# Patient Record
Sex: Male | Born: 1960 | Race: White | Hispanic: No | Smoking: Never smoker
Health system: Southern US, Community
[De-identification: ages and names within clinical notes are randomized; demographics above are authoritative.]

## PROBLEM LIST (undated history)

## (undated) DIAGNOSIS — E785 Hyperlipidemia, unspecified: Secondary | ICD-10-CM

## (undated) DIAGNOSIS — R0683 Snoring: Secondary | ICD-10-CM

## (undated) DIAGNOSIS — R7303 Prediabetes: Secondary | ICD-10-CM

## (undated) DIAGNOSIS — I10 Essential (primary) hypertension: Secondary | ICD-10-CM

## (undated) DIAGNOSIS — R079 Chest pain, unspecified: Principal | ICD-10-CM

## (undated) HISTORY — DX: Essential (primary) hypertension: I10

## (undated) HISTORY — PX: BACK SURGERY: SHX140

## (undated) HISTORY — DX: Prediabetes: R73.03

## (undated) HISTORY — DX: Hyperlipidemia, unspecified: E78.5

## (undated) HISTORY — DX: Snoring: R06.83

## (undated) HISTORY — PX: TESTICLE SURGERY: SHX794

## (undated) HISTORY — DX: Chest pain, unspecified: R07.9

---

## 2003-01-05 ENCOUNTER — Encounter: Payer: Self-pay | Admitting: Unknown Physician Specialty

## 2003-01-05 ENCOUNTER — Ambulatory Visit (HOSPITAL_COMMUNITY): Admission: RE | Admit: 2003-01-05 | Discharge: 2003-01-05 | Payer: Self-pay | Admitting: Unknown Physician Specialty

## 2003-01-17 ENCOUNTER — Encounter: Admission: RE | Admit: 2003-01-17 | Discharge: 2003-01-24 | Payer: Self-pay | Admitting: Unknown Physician Specialty

## 2003-02-13 ENCOUNTER — Encounter: Payer: Self-pay | Admitting: Neurosurgery

## 2003-02-13 ENCOUNTER — Ambulatory Visit (HOSPITAL_COMMUNITY): Admission: RE | Admit: 2003-02-13 | Discharge: 2003-02-14 | Payer: Self-pay | Admitting: Neurosurgery

## 2005-02-02 ENCOUNTER — Ambulatory Visit: Payer: Self-pay | Admitting: Family Medicine

## 2005-02-03 ENCOUNTER — Ambulatory Visit (HOSPITAL_COMMUNITY): Admission: RE | Admit: 2005-02-03 | Discharge: 2005-02-03 | Payer: Self-pay | Admitting: Family Medicine

## 2005-03-19 ENCOUNTER — Ambulatory Visit: Payer: Self-pay | Admitting: Family Medicine

## 2005-08-20 ENCOUNTER — Ambulatory Visit: Payer: Self-pay | Admitting: Family Medicine

## 2005-09-25 ENCOUNTER — Ambulatory Visit (HOSPITAL_BASED_OUTPATIENT_CLINIC_OR_DEPARTMENT_OTHER): Admission: RE | Admit: 2005-09-25 | Discharge: 2005-09-25 | Payer: Self-pay | Admitting: Urology

## 2007-05-13 ENCOUNTER — Emergency Department (HOSPITAL_COMMUNITY): Admission: EM | Admit: 2007-05-13 | Discharge: 2007-05-13 | Payer: Self-pay | Admitting: Emergency Medicine

## 2007-12-18 ENCOUNTER — Emergency Department (HOSPITAL_COMMUNITY): Admission: EM | Admit: 2007-12-18 | Discharge: 2007-12-18 | Payer: Self-pay | Admitting: Emergency Medicine

## 2010-12-26 NOTE — Op Note (Signed)
NAMEDRAYSON, DORKO                           ACCOUNT NO.:  [id number]   MEDICAL RECORD NO.:  [id number]                   PATIENT TYPE:  OIB   LOCATION:  2899                                 FACILITY:  [facility]   PHYSICIAN:  Reinaldo Meeker, M.D.              DATE OF BIRTH:  07/15/1961   DATE OF PROCEDURE:  02/13/2003  DATE OF DISCHARGE:                                 OPERATIVE REPORT   PREOPERATIVE DIAGNOSIS:  Herniated disk L5-S1 left.   POSTOPERATIVE DIAGNOSIS:  Herniated disk L5-S1 left.   PROCEDURE:  Left L5-S1 intralaminar laminotomy for excision of herniated  disk with the operating microscope.  Microdissection L5-S1 disk and S1 nerve  root.   SURGEON:  Reinaldo Meeker, M.D.   ASSISTANT:  Kathaleen Maser. Pool, M.D.   DESCRIPTION OF PROCEDURE:  After being placed in the prone position, the  patient's back was prepped and draped in the usual sterile fashion.  A  localizing x-ray was taken prior to incision to identify the appropriate  level.  A midline incision was made above the spinous processes of L5 and  S1. Using Bovie cutting current, the incision was carried down to the  spinous processes.  Subperiosteal dissection was then carried out on the  left side spinous processes of the lamina and McCullough self retaining  retractor was placed for exposure. A second x-ray showed approach at the L5-  S1 level. Using a highspeed drill the inferior 1/3 of the L5 lamina and the  medial 1/3 of the facet joint were removed.  It was then used to remove the  superior 1/3 of the S1 lamina. Residual bone ligamentum flavum removed in a  piecemeal fashion. The microscope was draped, brought into the field, and  used for the remainder of the case. Using microdissection technique, the  lateral aspect of the thecal sac and S1 nerve were identified.  Further  coagulation was carried forward to identify the L5-S1 disk which was found  to be tremendously herniated beneath the nerve root. After  coagulating on  the annulus, the annulus was incised with a 15 blade.  Using pituitary  rongeurs and curets, a thorough disk space cleanout was carried out. At the  same time, great care was taken to avoid injury to the neural elements. This  was successfully done. At this point, inspection was carried out in all  directions for any evidence of residual compression and none could be  identified.  Large amounts of irrigation were carried out and any bleeding  controlled with bipolar coagulation and Gelfoam. The wound was then closed  using Vicryl on the muscle, fascia, subcutaneous tissue, and subcuticular  tissues, and staples on the skin. A sterile dressing was then applied and  the patient was extubated and taken to the recovery room in stable  condition.  Reinaldo Meeker, M.D.    ROK/MEDQ  D:  02/13/2003  T:  02/13/2003  Job:  [id number]

## 2010-12-26 NOTE — Op Note (Signed)
Sean Mccarty, ROSENFIELD                 ACCOUNT NO.:  [id number]   MEDICAL RECORD NO.:  [id number]          PATIENT TYPE:  AMB   LOCATION:  [facility]                         FACILITY:  [facility]   PHYSICIAN:  Mark C. Vernie Ammons, M.D.  DATE OF BIRTH:  [birth year]   DATE OF PROCEDURE:  09/25/2005  DATE OF DISCHARGE:                                 OPERATIVE REPORT   PREOPERATIVE DIAGNOSIS:  Bilateral hydroceles, left greater than right.   POSTOPERATIVE DIAGNOSIS:  Bilateral hydroceles, left greater than right.   PROCEDURE:  Bilateral hydrocelectomy.   SURGEON:  Mark C. Vernie Ammons, M.D.   ANESTHESIA:  General.   SPECIMENS:  None.   BLOOD LOSS:  Minimal.   DRAINS:  None.   COMPLICATIONS:  None.   INDICATIONS:  The patient is a 50 year old white male who was seen for  swelling of the left hemiscrotum. It has become uncomfortable and is getting  in the way. He had had scrotal ultrasound previously revealing bilateral  hydroceles and a right epididymal cyst. He is brought to the O.R. today for  surgical correction of his hydroceles. He understands the risks,  complications, alternatives, and limitations.   DESCRIPTION OF OPERATION:  After informed consent, the patient was brought  to the major operating room, placed on the table, and administered general  anesthesia, then maintained in the supine position. His genitalia were  sterilely prepped and draped. A midline median raphe scrotal incision was  then made, and I used electrocautery to incise the tissue down to the  hydrocele on the left-hand side. This was drained, and then the testicle was  delivered. The hydrocele sac was then completely excised circumferentially.  Of note, the testicle appeared normal. The epididymis was noted to be  displaced from the body of the testicle except at the head region. The  appendix epididymis and testis were excised. I then replaced the left  testicle back in the left hemiscrotum and performed an identical  procedure  on the right-hand side. There was much less hydrocele fluid on that side.  Both sides were checked for bleeding, and none was noted. I therefore closed  the deep scrotal tissue with running 3-0 chromic suture. Quarter-percent  plain Marcaine was then injected in the subcutaneous tissue of the scrotum,  and the skin was closed with running 3-0 chromic. Neosporin, fluffed 4x4s,  and a scrotal support were then applied, and the patient was  awakened and taken to the recovery room in stable satisfactory condition. He  tolerated procedure well. There were no intraoperative complications. He  will be given a prescription for Tylox for pain, #36, and follow up my  office for recheck in 14 days.      Mark C. Vernie Ammons, M.D.  Electronically Signed     MCO/MEDQ  D:  09/25/2005  T:  09/25/2005  Job:  [id number]

## 2011-05-13 ENCOUNTER — Emergency Department (INDEPENDENT_AMBULATORY_CARE_PROVIDER_SITE_OTHER): Payer: Self-pay

## 2011-05-13 ENCOUNTER — Other Ambulatory Visit: Payer: Self-pay

## 2011-05-13 ENCOUNTER — Emergency Department (HOSPITAL_BASED_OUTPATIENT_CLINIC_OR_DEPARTMENT_OTHER)
Admission: EM | Admit: 2011-05-13 | Discharge: 2011-05-13 | Disposition: A | Discharge status: Home / Self Care | Payer: Self-pay | Attending: Emergency Medicine | Admitting: Emergency Medicine

## 2011-05-13 ENCOUNTER — Encounter: Payer: Self-pay | Admitting: *Deleted

## 2011-05-13 DIAGNOSIS — R079 Chest pain, unspecified: Secondary | ICD-10-CM

## 2011-05-13 DIAGNOSIS — R0789 Other chest pain: Secondary | ICD-10-CM

## 2011-05-13 DIAGNOSIS — R071 Chest pain on breathing: Secondary | ICD-10-CM | POA: Insufficient documentation

## 2011-05-13 LAB — D-DIMER, QUANTITATIVE: D-Dimer, Quant: 0.27 ug/mL-FEU (ref 0.00–0.48)

## 2011-05-13 MED ORDER — OXYCODONE-ACETAMINOPHEN 5-325 MG PO TABS
1.0000 | ORAL_TABLET | Freq: Once | ORAL | Status: AC
  Administered 2011-05-13: 1 via ORAL
  Filled 2011-05-13: qty 1

## 2011-05-13 MED ORDER — IBUPROFEN 800 MG PO TABS: 800.0000 mg | ORAL_TABLET | Freq: Three times a day (TID) | ORAL | Status: AC

## 2011-05-13 MED ORDER — HYDROCODONE-ACETAMINOPHEN 5-325 MG PO TABS: 2.0000 | ORAL_TABLET | ORAL | Status: AC | PRN

## 2011-05-13 NOTE — ED Notes (Signed)
Pt c/o left upper rib pain for few months, denies injury, no SOB

## 2011-05-13 NOTE — ED Provider Notes (Signed)
History     CSN: [id number] Arrival date & time: 05/13/2011  7:58 PM  Chief Complaint  Patient presents with  . Chest Pain    (Consider location/radiation/quality/duration/timing/severity/associated sxs/prior treatment) HPI Comments: Patient presents with left sided rib pain that has been intermittent for the past 3 months. 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 The pain worsens with palpation and worse with movement of his left arm. It is reproducible to palpation along the left lateral chest wall. He denies any cough, fever, shortness of breath. Denies any anterior chest pain. Abdominal pain, nausea or vomiting. Is not taking anything for the pain. Is not worse with any deep breath.  The history is provided by the patient.    History reviewed. No pertinent past medical history.  History reviewed. No pertinent past surgical history.  History reviewed. No pertinent family history.  History  Substance Use Topics  . Smoking status: Not on file  . Smokeless tobacco: Not on file  . Alcohol Use: Not on file      Review of Systems  Constitutional: Negative for activity change and appetite change.  HENT: Negative for congestion and rhinorrhea.   Respiratory: Negative for cough, chest tightness and shortness of breath.   Cardiovascular: Positive for chest pain.  Gastrointestinal: Negative for nausea, vomiting, abdominal pain and diarrhea.  Genitourinary: Negative for dysuria and hematuria.  Musculoskeletal: Negative for back pain.  Neurological: Negative for weakness and headaches.  Psychiatric/Behavioral: Negative for confusion.    Allergies  Review of patient's allergies indicates no known allergies.  Home Medications  No current outpatient prescriptions on file.  BP 139/91  Pulse 77  Temp(Src) 99.1 F (37.3 C) (Oral)  Ht 5\' 9"  (1.753 m)  Wt 215 lb (97.523 kg)  BMI 31.75 kg/m2  SpO2 99%  Physical Exam  Constitutional: He is oriented to person,  place, and time. He appears well-developed and well-nourished. No distress.  HENT:  Head: Normocephalic and atraumatic.  Mouth/Throat: Oropharynx is clear and moist. No oropharyngeal exudate.  Eyes: Conjunctivae are normal. Pupils are equal, round, and reactive to light.  Neck: Normal range of motion.  Cardiovascular: Normal rate, regular rhythm and normal heart sounds.   Pulmonary/Chest: Effort normal and breath sounds normal. No respiratory distress. He exhibits tenderness.       Reproducible tenderness to palpation of left anterior lateral rib  Abdominal: Soft. There is no tenderness. There is no rebound and no guarding.  Musculoskeletal: Normal range of motion. He exhibits no edema and no tenderness.  Neurological: He is alert and oriented to person, place, and time. No cranial nerve deficit.  Skin: Skin is warm.    ED Course  Procedures (including critical care time)   Labs Reviewed  D-DIMER, QUANTITATIVE   Dg Chest 2 View  05/13/2011  *RADIOLOGY REPORT*  Clinical Data: Chest pain  CHEST - 2 VIEW  Comparison: 12/18/2007  Findings: Cardiomediastinal silhouette is stable.  No acute infiltrate or pleural effusion.  No pulmonary edema. Mild degenerative changes lower thoracic spine.  IMPRESSION: No active disease.  Original Report Authenticated By: Natasha Mead, M.D.     No diagnosis found.    MDM  Muscular skeletal chest wall pain. This pain is reproducible to palpation and worsens with movement of the chest wall and arm. I am not suspicious of ACS or any other life-threatening cause of chest pain.   Date: 05/13/2011  Rate: 76  Rhythm: normal sinus rhythm  QRS Axis: normal  Intervals: normal  ST/T Wave abnormalities: normal  Conduction Disutrbances:none  Narrative Interpretation:   Old EKG Reviewed: none available        Glynn Octave, MD 05/13/11 2136

## 2011-07-11 ENCOUNTER — Emergency Department (INDEPENDENT_AMBULATORY_CARE_PROVIDER_SITE_OTHER): Payer: Self-pay

## 2011-07-11 ENCOUNTER — Emergency Department (HOSPITAL_BASED_OUTPATIENT_CLINIC_OR_DEPARTMENT_OTHER)
Admission: EM | Admit: 2011-07-11 | Discharge: 2011-07-11 | Disposition: A | Discharge status: Home / Self Care | Payer: Self-pay | Attending: Emergency Medicine | Admitting: Emergency Medicine

## 2011-07-11 ENCOUNTER — Encounter (HOSPITAL_BASED_OUTPATIENT_CLINIC_OR_DEPARTMENT_OTHER): Payer: Self-pay | Admitting: *Deleted

## 2011-07-11 DIAGNOSIS — R079 Chest pain, unspecified: Secondary | ICD-10-CM | POA: Insufficient documentation

## 2011-07-11 DIAGNOSIS — R071 Chest pain on breathing: Secondary | ICD-10-CM | POA: Insufficient documentation

## 2011-07-11 DIAGNOSIS — R0789 Other chest pain: Secondary | ICD-10-CM

## 2011-07-11 LAB — BASIC METABOLIC PANEL
Calcium: 9.1 mg/dL (ref 8.4–10.5)
Chloride: 104 mEq/L (ref 96–112)
Creatinine, Ser: 1.1 mg/dL (ref 0.50–1.35)
GFR calc Af Amer: 87 mL/min — ABNORMAL LOW (ref >90)
GFR calc non Af Amer: 75 mL/min — ABNORMAL LOW (ref >90)

## 2011-07-11 LAB — CBC
MCV: 84.9 fL (ref 78.0–100.0)
Platelets: 168 10*3/uL (ref 150–400)
WBC: 5.9 10*3/uL (ref 4.0–10.5)

## 2011-07-11 LAB — DIFFERENTIAL
Basophils Relative: 0 % (ref 0–1)
Eosinophils Relative: 2 % (ref 0–5)
Lymphs Abs: 1.9 10*3/uL (ref 0.7–4.0)
Monocytes Absolute: 0.5 10*3/uL (ref 0.1–1.0)
Monocytes Relative: 8 % (ref 3–12)
Neutro Abs: 3.4 10*3/uL (ref 1.7–7.7)
Neutrophils Relative %: 57 % (ref 43–77)

## 2011-07-11 LAB — D-DIMER, QUANTITATIVE: D-Dimer, Quant: 0.22 ug/mL-FEU (ref 0.00–0.48)

## 2011-07-11 MED ORDER — OXYCODONE-ACETAMINOPHEN 5-325 MG PO TABS: 1.0000 | ORAL_TABLET | ORAL | Status: AC | PRN

## 2011-07-11 MED ORDER — NAPROXEN 500 MG PO TABS: 500.0000 mg | ORAL_TABLET | Freq: Two times a day (BID) | ORAL | Status: DC

## 2011-07-11 MED ORDER — KETOROLAC TROMETHAMINE 30 MG/ML IJ SOLN
30.0000 mg | Freq: Once | INTRAMUSCULAR | Status: AC
  Administered 2011-07-11: 30 mg via INTRAVENOUS
  Filled 2011-07-11: qty 1

## 2011-07-11 MED ORDER — METHYLPREDNISOLONE SODIUM SUCC 125 MG IJ SOLR: 125.0000 mg | Freq: Once | INTRAMUSCULAR | Status: DC

## 2011-07-11 MED ORDER — PREDNISONE 50 MG PO TABS: 50.0000 mg | ORAL_TABLET | Freq: Every day | ORAL | Status: AC

## 2011-07-11 NOTE — ED Notes (Signed)
Pt reports L sided chest pain with deep inspiration  Denies SOB

## 2011-07-11 NOTE — ED Provider Notes (Signed)
History  Scribed for Dione Booze, MD, the patient was seen in room MH09/MH09. This chart was scribed by Candelaria Stagers. The patient's care started at 16:21.     CSN: [id number] Arrival date & time: 07/11/2011  4:01 PM   First MD Initiated Contact with Patient 07/11/11 1610      Chief Complaint  Patient presents with  . Chest Pain     The history is provided by the patient.   Sean Mccarty is a 50 y.o. male who presents to the Emergency Department complaining of constant, achy, burning pain of the left rib area that comes across the chest that he has been experiencing on and off for the past three months but has gotten worse over the last week.  Patient states the pain is 8/10 and 10/10 at worse.  He has experienced fever, sweating, some dizziness, occasional SOB, and an unproductive cough.  He has had no nausea.  Putting pressure on the area causes it to feel worse, but nothing alleviates the pain.  He has taken tylenol with no relief.  His PCP is Dr. Excell Seltzer.  He does not drink or smoke.     History reviewed. No pertinent past medical history.  Past Surgical History  Procedure Date  . Back surgery     History reviewed. No pertinent family history.  History  Substance Use Topics  . Smoking status: Not on file  . Smokeless tobacco: Not on file  . Alcohol Use:       Review of Systems  Constitutional: Positive for fever and diaphoresis. Negative for chills.  Respiratory: Positive for cough and shortness of breath.   Cardiovascular: Positive for chest pain (left chest pain along ribs).  Gastrointestinal: Negative for nausea and vomiting.  Neurological: Positive for dizziness.    Allergies  Review of patient's allergies indicates no known allergies.  Home Medications   Current Outpatient Rx  Name Route Sig Dispense Refill  . ACETAMINOPHEN 500 MG PO TABS Oral Take 1,000 mg by mouth every 6 (six) hours as needed. For pain       BP 157/93  Pulse 68  Temp(Src) 98 F  (36.7 C) (Oral)  Resp 18  Ht 5\' 9"  (1.753 m)  Wt 215 lb (97.523 kg)  BMI 31.75 kg/m2  SpO2 99%  Physical Exam  Nursing note and vitals reviewed. Constitutional: He is oriented to person, place, and time. He appears well-developed and well-nourished. No distress.  HENT:  Head: Normocephalic and atraumatic.  Right Ear: Tympanic membrane and external ear normal.  Left Ear: Tympanic membrane and external ear normal.  Eyes: Conjunctivae and EOM are normal.  Neck: Normal range of motion. Neck supple.  Cardiovascular: Normal rate, regular rhythm and normal heart sounds.   Pulmonary/Chest: Effort normal and breath sounds normal. He exhibits tenderness (Tenderness to palpation along the left chest wall tracking along the 7th rib. ).  Abdominal: Soft. He exhibits no distension.  Lymphadenopathy:    He has no cervical adenopathy.  Neurological: He is alert and oriented to person, place, and time.       Dizziness is not reproduced by head movement   Skin: Skin is warm and dry.  Psychiatric: He has a normal mood and affect. His behavior is normal.    ED Course  Procedures   DIAGNOSTIC STUDIES: Oxygen Saturation is 99% on room air, normal by my interpretation.   Results for orders placed during the hospital encounter of 07/11/11  CBC  Component Value Range   WBC 5.9  4.0 - 10.5 (K/uL)   RBC 5.23  4.22 - 5.81 (MIL/uL)   Hemoglobin 15.6  13.0 - 17.0 (g/dL)   HCT 16.1  09.6 - 04.5 (%)   MCV 84.9  78.0 - 100.0 (fL)   MCH 29.8  26.0 - 34.0 (pg)   MCHC 35.1  30.0 - 36.0 (g/dL)   RDW 40.9  81.1 - 91.4 (%)   Platelets 168  150 - 400 (K/uL)  DIFFERENTIAL      Component Value Range   Neutrophils Relative 57  43 - 77 (%)   Neutro Abs 3.4  1.7 - 7.7 (K/uL)   Lymphocytes Relative 33  12 - 46 (%)   Lymphs Abs 1.9  0.7 - 4.0 (K/uL)   Monocytes Relative 8  3 - 12 (%)   Monocytes Absolute 0.5  0.1 - 1.0 (K/uL)   Eosinophils Relative 2  0 - 5 (%)   Eosinophils Absolute 0.1  0.0 - 0.7  (K/uL)   Basophils Relative 0  0 - 1 (%)   Basophils Absolute 0.0  0.0 - 0.1 (K/uL)  BASIC METABOLIC PANEL      Component Value Range   Sodium 139  135 - 145 (mEq/L)   Potassium 4.2  3.5 - 5.1 (mEq/L)   Chloride 104  96 - 112 (mEq/L)   CO2 26  19 - 32 (mEq/L)   Glucose, Bld 98  70 - 99 (mg/dL)   BUN 16  6 - 23 (mg/dL)   Creatinine, Ser 7.82  0.50 - 1.35 (mg/dL)   Calcium 9.1  8.4 - 95.6 (mg/dL)   GFR calc non Af Amer 75 (*) >90 (mL/min)   GFR calc Af Amer 87 (*) >90 (mL/min)  D-DIMER, QUANTITATIVE      Component Value Range   D-Dimer, Quant 0.22  0.00 - 0.48 (ug/mL-FEU)   Dg Chest 2 View  07/11/2011  *RADIOLOGY REPORT*  Clinical Data: Chest pain.  CHEST - 2 VIEW  Comparison: Chest x-ray 05/13/2011.  Findings: The cardiac silhouette, mediastinal and hilar contours are within normal limits and stable.  There are mild bronchitic type lung changes with peribronchial thickening and slight increased interstitial markings.  No infiltrates, edema or effusions.  The bony thorax is intact.  IMPRESSION: Bronchitic type lung changes may suggest bronchitis.  No focal infiltrates.  Original Report Authenticated By: P. Loralie Champagne, M.D.     COORDINATION OF CARE:  16:39 Ordered: EKG test ; CBC ; Differential ; Basic metabolic panel ; D-dimer, quantitative ; DG Chest 2 View    17:40 Recheck: Discussed course of care with patient.  Advised patient to follow up with PCP in one day.    Labs Reviewed  CBC  DIFFERENTIAL  D-DIMER, QUANTITATIVE  BASIC METABOLIC PANEL   Dg Chest 2 View  07/11/2011  *RADIOLOGY REPORT*  Clinical Data: Chest pain.  CHEST - 2 VIEW  Comparison: Chest x-ray 05/13/2011.  Findings: The cardiac silhouette, mediastinal and hilar contours are within normal limits and stable.  There are mild bronchitic type lung changes with peribronchial thickening and slight increased interstitial markings.  No infiltrates, edema or effusions.  The bony thorax is intact.  IMPRESSION:  Bronchitic type lung changes may suggest bronchitis.  No focal infiltrates.  Original Report Authenticated By: P. Loralie Champagne, M.D.     No diagnosis found.  ECG shows normal sinus rhythm with a rate of 67, no ectopy. Normal axis. Normal P wave. Normal QRS. Normal  intervals. Normal ST and T waves. Impression: normal ECG. Compared with ECG of 05/13/2011, no significant changes are noted.   MDM  Chest pain which seems to follow a radicular pattern. No rash present to indicate shingles or postherpetic neuralgia.  Evaluation and management procedures were performed by the PA/NP under my supervision/collaboration.         Dione Booze, MD 07/12/11 [phone]

## 2011-07-11 NOTE — ED Notes (Signed)
Pt describes left rib pain off and on for 2-3 days. Dizzy x 1 week. +diaphoresis. Seen here about 3 months ago with similar s/s. "Rib swollen"

## 2011-07-11 NOTE — ED Notes (Signed)
Pt reports "rib pain to L side"

## 2012-05-05 ENCOUNTER — Encounter (HOSPITAL_COMMUNITY): Payer: Self-pay | Admitting: Emergency Medicine

## 2012-05-05 ENCOUNTER — Emergency Department (HOSPITAL_COMMUNITY)
Admission: EM | Admit: 2012-05-05 | Discharge: 2012-05-05 | Discharge status: Against Medical Advice | Payer: Self-pay | Attending: Emergency Medicine | Admitting: Emergency Medicine

## 2012-05-05 DIAGNOSIS — R1013 Epigastric pain: Secondary | ICD-10-CM | POA: Insufficient documentation

## 2012-05-05 NOTE — ED Notes (Signed)
Pt c/o burning under the left breast/epigastric region for months, but has been getting worse. Pt states it burns worse after he eats or sitting, but eases when he lays flat.

## 2012-05-07 ENCOUNTER — Emergency Department (HOSPITAL_COMMUNITY)
Admission: EM | Admit: 2012-05-07 | Discharge: 2012-05-07 | Disposition: A | Discharge status: Home / Self Care | Payer: Self-pay | Attending: Emergency Medicine | Admitting: Emergency Medicine

## 2012-05-07 ENCOUNTER — Encounter (HOSPITAL_COMMUNITY): Payer: Self-pay | Admitting: Emergency Medicine

## 2012-05-07 ENCOUNTER — Emergency Department (HOSPITAL_COMMUNITY): Payer: Self-pay

## 2012-05-07 DIAGNOSIS — R079 Chest pain, unspecified: Secondary | ICD-10-CM | POA: Insufficient documentation

## 2012-05-07 LAB — CBC
Platelets: 187 10*3/uL (ref 150–400)
RBC: 5.28 MIL/uL (ref 4.22–5.81)
WBC: 5 10*3/uL (ref 4.0–10.5)

## 2012-05-07 LAB — COMPREHENSIVE METABOLIC PANEL
ALT: 28 U/L (ref 0–53)
AST: 22 U/L (ref 0–37)
Glucose, Bld: 117 mg/dL — ABNORMAL HIGH (ref 70–99)
Potassium: 3.7 mEq/L (ref 3.5–5.1)
Sodium: 136 mEq/L (ref 135–145)
Total Bilirubin: 1.3 mg/dL — ABNORMAL HIGH (ref 0.3–1.2)

## 2012-05-07 LAB — POCT I-STAT TROPONIN I: Troponin i, poc: 0 ng/mL (ref 0.00–0.08)

## 2012-05-07 NOTE — ED Notes (Signed)
Chest pain on and off for 5-6 months. Described as burning rated 10/10 @ it's worse for pain scale. Pain causes patient to have to stop what he is doing. Trys to reposition self for relief. The only relief 0/10 @ rest laying down. Patient went to [facility] ED Thurs. Night for the same compliment however, due to length of stay left. Patient denies any accompanying symptoms. However, has noted for the last 2 weeks awaken with sob.

## 2012-05-07 NOTE — ED Notes (Signed)
EKG shown to Dr. Steinl 

## 2012-05-07 NOTE — ED Notes (Signed)
MD at bedside. 

## 2012-05-07 NOTE — ED Notes (Signed)
Pt reports having ongoing chest pain for several months, however over the last week it has worsened, currently 8/10 left sided chest pain. Pain worsens with activity.

## 2012-05-07 NOTE — ED Provider Notes (Signed)
History     CSN: [id number]  Arrival date & time 05/07/12  1136   First MD Initiated Contact with Patient 05/07/12 1218      Chief Complaint  Patient presents with  . Chest Pain    (Consider location/radiation/quality/duration/timing/severity/associated sxs/prior treatment) Patient is a 51 y.o. male presenting with chest pain. The history is provided by the patient.  Chest Pain Pertinent negatives for primary symptoms include no fever, no shortness of breath, no cough and no abdominal pain.   pt c/o left cp for '5-6 months'. Constant. Dull. No specific exacerbating or alleviating factors. No radiation. Is localized to small area left lower chest. Denies associated sob, nv or diaphoresis. No change whether upright or supine. No relation to activity level or exertion. Pain is not pleuritic. Denies hx smoking or high cholesterol ?heart problems in siblings, parents without hx cad. Denies prior stress test. No leg pain or swelling, no immobility, trauma, surgery, travel. No hx dvt or pe. No chest wall injury or strain. No abd pain. No nvd. ?hx gerd. No acute or abrupt change in pain today. No cough or uri c/o. No fever or chills.     History reviewed. No pertinent past medical history.  Past Surgical History  Procedure Date  . Back surgery   . Testicle surgery     History reviewed. No pertinent family history.  History  Substance Use Topics  . Smoking status: Never Smoker   . Smokeless tobacco: Never Used  . Alcohol Use: No      Review of Systems  Constitutional: Negative for fever and chills.  HENT: Negative for neck pain.   Eyes: Negative for redness.  Respiratory: Negative for cough and shortness of breath.   Cardiovascular: Positive for chest pain.  Gastrointestinal: Negative for abdominal pain.  Genitourinary: Negative for flank pain.  Musculoskeletal: Negative for back pain.  Skin: Negative for rash.  Neurological: Negative for headaches.  Hematological: Does  not bruise/bleed easily.  Psychiatric/Behavioral: Negative for confusion.    Allergies  Review of patient's allergies indicates no known allergies.  Home Medications  No current outpatient prescriptions on file.  BP 144/81  Pulse 70  Temp 98.8 F (37.1 C) (Oral)  Resp 24  SpO2 100%  Physical Exam  Nursing note and vitals reviewed. Constitutional: He is oriented to person, place, and time. He appears well-developed and well-nourished. No distress.  HENT:  Head: Atraumatic.  Eyes: Conjunctivae normal are normal.  Neck: Neck supple. No tracheal deviation present.  Cardiovascular: Normal rate, regular rhythm, normal heart sounds and intact distal pulses.  Exam reveals no gallop and no friction rub.   No murmur heard. Pulmonary/Chest: Effort normal and breath sounds normal. No accessory muscle usage. No respiratory distress. He exhibits tenderness.  Abdominal: Soft. Bowel sounds are normal. He exhibits no distension and no mass. There is no tenderness. There is no rebound and no guarding.  Musculoskeletal: Normal range of motion. He exhibits no edema and no tenderness.  Neurological: He is alert and oriented to person, place, and time.  Skin: Skin is warm and dry. No rash noted.  Psychiatric: He has a normal mood and affect.    ED Course  Procedures (including critical care time)  Results for orders placed during the hospital encounter of 05/07/12  TROPONIN I      Component Value Range   Troponin I <0.30  <0.30 ng/mL  CBC      Component Value Range   WBC 5.0  4.0 - 10.5  K/uL   RBC 5.28  4.22 - 5.81 MIL/uL   Hemoglobin 16.3  13.0 - 17.0 g/dL   HCT 25.3  66.4 - 40.3 %   MCV 84.7  78.0 - 100.0 fL   MCH 30.9  26.0 - 34.0 pg   MCHC 36.5 (*) 30.0 - 36.0 g/dL   RDW 47.4  25.9 - 56.3 %   Platelets 187  150 - 400 K/uL  COMPREHENSIVE METABOLIC PANEL      Component Value Range   Sodium 136  135 - 145 mEq/L   Potassium 3.7  3.5 - 5.1 mEq/L   Chloride 102  96 - 112 mEq/L   CO2  23  19 - 32 mEq/L   Glucose, Bld 117 (*) 70 - 99 mg/dL   BUN 17  6 - 23 mg/dL   Creatinine, Ser 8.75  0.50 - 1.35 mg/dL   Calcium 9.0  8.4 - 64.3 mg/dL   Total Protein 6.9  6.0 - 8.3 g/dL   Albumin 4.0  3.5 - 5.2 g/dL   AST 22  0 - 37 U/L   ALT 28  0 - 53 U/L   Alkaline Phosphatase 45  39 - 117 U/L   Total Bilirubin 1.3 (*) 0.3 - 1.2 mg/dL   GFR calc non Af Amer 79 (*) >90 mL/min   GFR calc Af Amer >90  >90 mL/min  POCT I-STAT TROPONIN I      Component Value Range   Troponin i, poc 0.00  0.00 - 0.08 ng/mL   Comment 3            Dg Chest 2 View  05/07/2012  *RADIOLOGY REPORT*  Clinical Data: Chest pain  CHEST - 2 VIEW  Comparison: 07/11/2011  Findings: Lungs are clear. No pleural effusion or pneumothorax.  Cardiomediastinal silhouette is within normal limits.  Degenerative changes of the visualized thoracolumbar spine.  IMPRESSION: No evidence of acute cardiopulmonary disease.   Original Report Authenticated By: Charline Bills, M.D.        MDM  Iv ns. Monitor. Labs. Ecg.   Reviewed nursing notes and prior charts for additional history.    Date: 05/07/2012  Rate: 74  Rhythm: normal sinus rhythm  QRS Axis: normal  Intervals: normal  ST/T Wave abnormalities: normal  Conduction Disutrbances:none  Narrative Interpretation:   Old EKG Reviewed: unchanged   pts symptoms atypical, felt not c/w acs. After constant symptoms > 24 hrs, trop 0.  Pt w chest wall tenderness. 2 prior ed evals for same, prior ddimers normal. Currently no pleuritic pain, no sob, no tachycardia or tachypnea, no risk factors for dvt/pe.   Given age, no prior stress test/cardiac eval and now 3 ed visits for atypical cp, and ?hx cad in siblings, will give referral to close card eval re possible stress test       Suzi Roots, MD 05/07/12 [phone]

## 2012-05-11 ENCOUNTER — Encounter: Payer: Self-pay | Admitting: Internal Medicine

## 2012-05-11 ENCOUNTER — Ambulatory Visit (INDEPENDENT_AMBULATORY_CARE_PROVIDER_SITE_OTHER): Payer: Self-pay | Admitting: Internal Medicine

## 2012-05-11 ENCOUNTER — Encounter: Payer: Self-pay | Admitting: *Deleted

## 2012-05-11 VITALS — BP 135/88 | HR 66 | Ht 69.0 in | Wt 255.0 lb

## 2012-05-11 DIAGNOSIS — R0683 Snoring: Secondary | ICD-10-CM

## 2012-05-11 DIAGNOSIS — R400 Somnolence: Secondary | ICD-10-CM

## 2012-05-11 DIAGNOSIS — Z8249 Family history of ischemic heart disease and other diseases of the circulatory system: Secondary | ICD-10-CM

## 2012-05-11 DIAGNOSIS — R079 Chest pain, unspecified: Secondary | ICD-10-CM

## 2012-05-11 DIAGNOSIS — Z13220 Encounter for screening for lipoid disorders: Secondary | ICD-10-CM

## 2012-05-11 DIAGNOSIS — R0989 Other specified symptoms and signs involving the circulatory and respiratory systems: Secondary | ICD-10-CM

## 2012-05-11 DIAGNOSIS — G4710 Hypersomnia, unspecified: Secondary | ICD-10-CM

## 2012-05-11 HISTORY — DX: Snoring: R06.83

## 2012-05-11 HISTORY — DX: Chest pain, unspecified: R07.9

## 2012-05-11 NOTE — Patient Instructions (Signed)
   Exercise Stress Test  Labs:  Fasting lipid & liver panel Reminder:  Nothing to eat or drink after 12 midnight prior to labs. Sleep study - referral to Dr. Andrey Campanile Office will contact with results Over the counter (OTC) PPI (reflux med) OTC - NSAID (anti-inflammatory med)  Follow up based on above results

## 2012-05-11 NOTE — Assessment & Plan Note (Signed)
The patient has a high-risk scenario with his family history. Otherwise he stratified to intermediate-low risk with a normal ECG and atypical history. I recommended that we undertake stress testing which we will do with perfusion imaging given the not low risk situation. I suspect the pain is musculoskeletal in origin and have suggested that he take NSAIDs, specifically either naproxen or ibuprofen as they are low risk for coronary disease. In addition I suggested he take an over-the-counter PPI given the postprandial burning. If the pain discomfort does not abate however, then I think specific imaging of the rib cage would be in order as it is really quite discretely tender in a specific point raising the possibility of a lesion in the rib although I think that is less likely.

## 2012-05-11 NOTE — Assessment & Plan Note (Signed)
With daytime somnolence and significant obstructive breathing patterns, we will set him up for an outpatient sleep study

## 2012-05-11 NOTE — Progress Notes (Signed)
   History and Physical  Patient ID: Sean Mccarty MRN: [id number], SOB: 07/28/1961 51 y.o. Date of Encounter: 05/11/2012, 4:58 PM  Primary Physician: No primary provider on file. Primary Cardiologist: New Chief Complaint: Chest pain  History of Present Illness: Sean Mccarty is a 51 y.o. male seen in the emergency room last week. Chest pain evaluation included troponins negative x2 and electrocardiogram that was normal  His chest pain is quite atypical. It has been around for about 5 or 6 months. It is aggravated by using his left upper arm as well as by sitting up and is improved by lying down it is not aggravated by walking. He is taking Aleve with some improvement. He describes it as a burning syndrome. His further aggravated by eating.  His been seen on 3 occasions in the last few months for this chest pain syndrome in the emergency room. His family history is notable for his sister who died "of a massive MI" in his brother who had a heart attack but survived. He does not know his cholesterol status he does not use cigarettes. He does not have known hypertension.  He does have significant snoring with daytime somnolence.   No past medical history on file.   Past Surgical History  Procedure Date  . Back surgery   . Testicle surgery       No current outpatient prescriptions on file.     Allergies: No Known Allergies   History  Substance Use Topics  . Smoking status: Never Smoker   . Smokeless tobacco: Never Used  . Alcohol Use: No      Family history notable for coronary artery disease as above  ROS:  Please see the history of present illness.     All other systems reviewed and negative.   Vital Signs: Blood pressure 135/88, pulse 66, height 5\' 9"  (1.753 m), weight 255 lb (115.667 kg), SpO2 98.00%.  PHYSICAL EXAM: General:  Well nourished, well developed obese male in no acute distress HEENT: normal Lymph: no adenopathy Neck: no JVD Endocrine:  No  thryomegaly Vascular: No carotid bruits; FA pulses 2+ bilaterally without bruits Cardiac:  normal S1, S2; RRR; no murmur Back: without kyphosis/scoliosis, no CVA tenderness RIBS there is point tenderness along the medial portion of his left lower rib Lungs:  clear to auscultation bilaterally, no wheezing, rhonchi or rales Abd: soft, nontender, no hepatomegaly Ext: no edema Musculoskeletal:  No deformities, BUE and BLE strength normal and equal Skin: warm and dry Neuro:  CNs 2-12 intact, no focal abnormalities noted Psych:  Normal affect   EKG:  Normal @ [place] on 9/28 Labs:   Lab Results  Component Value Date   WBC 5.0 05/07/2012   HGB 16.3 05/07/2012   HCT 44.7 05/07/2012   MCV 84.7 05/07/2012   PLT 187 05/07/2012    Lab 05/07/12 1211  NA 136  K 3.7  CL 102  CO2 23  BUN 17  CREATININE 1.06  CALCIUM 9.0  PROT 6.9  BILITOT 1.3*  ALKPHOS 45  ALT 28  AST 22  GLUCOSE 117*   No results found for this basename: CKTOTAL:4,CKMB:4,TROPONINI:4 in the last 72 hours No results found for this basename: CHOL, HDL, LDLCALC, TRIG   Lab Results  Component Value Date   DDIMER 0.22 07/11/2011   BNP No results found for this basename: probnp       ASSESSMENT AND PLAN:

## 2012-05-17 ENCOUNTER — Encounter (HOSPITAL_COMMUNITY): Payer: Self-pay

## 2012-05-19 ENCOUNTER — Ambulatory Visit (HOSPITAL_COMMUNITY): Payer: Self-pay | Attending: Cardiology | Admitting: Radiology

## 2012-05-19 ENCOUNTER — Other Ambulatory Visit: Payer: Self-pay | Admitting: *Deleted

## 2012-05-19 VITALS — BP 113/77 | Ht 69.0 in | Wt 250.0 lb

## 2012-05-19 DIAGNOSIS — R0989 Other specified symptoms and signs involving the circulatory and respiratory systems: Secondary | ICD-10-CM | POA: Insufficient documentation

## 2012-05-19 DIAGNOSIS — R0609 Other forms of dyspnea: Secondary | ICD-10-CM | POA: Insufficient documentation

## 2012-05-19 DIAGNOSIS — R002 Palpitations: Secondary | ICD-10-CM | POA: Insufficient documentation

## 2012-05-19 DIAGNOSIS — R079 Chest pain, unspecified: Secondary | ICD-10-CM

## 2012-05-19 DIAGNOSIS — I479 Paroxysmal tachycardia, unspecified: Secondary | ICD-10-CM

## 2012-05-19 MED ORDER — TECHNETIUM TC 99M SESTAMIBI GENERIC - CARDIOLITE
11.0000 | Freq: Once | INTRAVENOUS | Status: AC | PRN
  Administered 2012-05-19: 11 via INTRAVENOUS

## 2012-05-19 MED ORDER — TECHNETIUM TC 99M SESTAMIBI GENERIC - CARDIOLITE
33.0000 | Freq: Once | INTRAVENOUS | Status: AC | PRN
  Administered 2012-05-19: 33 via INTRAVENOUS

## 2012-05-19 MED ORDER — ADENOSINE 6 MG/2ML IV SOLN
6.0000 mg | Freq: Once | INTRAVENOUS | Status: AC
  Administered 2012-05-19: 6 mg via INTRAVENOUS

## 2012-05-19 NOTE — Progress Notes (Signed)
[facility] SITE 3 NUCLEAR MED [address] [id number] [place] [address] [phone]  Cardiology Nuclear Med Study  OBED SAMEK is a 51 y.o. male     MRN : [id number]     DOB: April 03, 1961  Procedure Date: 05/19/2012  Nuclear Med Background Indication for Stress Test:  Evaluation for Ischemia History:  n/a Cardiac Risk Factors: Family History - CAD  Symptoms:  Chest Pain, DOE, Fatigue, Palpitations and SOB   Nuclear Pre-Procedure Caffeine/Decaff Intake:  None NPO After: 8:30pm   Lungs:  clear O2 Sat: 98% on room air. IV 0.9% NS with Angio Cath:  20g  IV Site: R Hand  IV Started by:  Bonnita Levan, RN  Chest Size (in):  50 Cup Size: n/a  Height: 5\' 9"  (1.753 m)  Weight:  250 lb (113.399 kg)  BMI:  Body mass index is 36.92 kg/(m^2). Tech Comments: This patient walked on the treadmill for over 8 minutes. As soon as he went into recovery,he went into a narrow complex tachycardia. DOD S.Graciela Husbands was consulted and advised Adenosine 6mg  IV push. It was given and slowed the rate down long enough to see the underlying rhythm was AFlutter. The patient rested and pictures checked. The patient converted to a SR and the pictures were good. Dr. Graciela Husbands wants the pt to have an event monitor, but due to having no insurance we are having to go a different route to get that done. It is being worked on.    Nuclear Med Study 1 or 2 day study: 1 day  Stress Test Type:  Stress  Reading MD: Marca Ancona, MD  Order Authorizing Provider:  Berton Mount, MD  Resting Radionuclide: Technetium 42m Sestamibi  Resting Radionuclide Dose: 11.0 mCi   Stress Radionuclide:  Technetium 58m Sestamibi  Stress Radionuclide Dose: 33.0 mCi           Stress Protocol Rest HR: 70 Stress HR: 200-300  Rest BP: 113/77 Stress BP: 191/58  Exercise Time (min): 8:16 METS: 10.10   Predicted Max HR: 167 bpm % Max HR: 119.76 bpm Rate Pressure Product: 62952   Dose of Adenosine (mg):  n/a Dose of  Lexiscan: n/a mg  Dose of Atropine (mg): n/a Dose of Dobutamine: n/a mcg/kg/min (at max HR)  Stress Test Technologist: Milana Na, EMT-P  Nuclear Technologist:  Domenic Polite, CNMT     Rest Procedure:  Myocardial perfusion imaging was performed at rest 45 minutes following the intravenous administration of Technetium 100m Sestamibi. Rest ECG: NSR - Normal EKG  Stress Procedure:  The patient performed treadmill exercise using a Bruce  Protocol for 8:16 minutes. The patient stopped due to sob, fatigue, and denied any chest pain.  There were non specific ST-T wave changes and rare pvcs.  Technetium 62m Sestamibi was injected at peak exercise and myocardial perfusion imaging was performed after a brief delay. Stress ECG: No significant change from baseline ECG  QPS Raw Data Images:  Normal; no motion artifact; normal heart/lung ratio. Stress Images:  Normal homogeneous uptake in all areas of the myocardium. Rest Images:  Normal homogeneous uptake in all areas of the myocardium. Subtraction (SDS):  There is no evidence of scar or ischemia. Transient Ischemic Dilatation (Normal <1.22):  0.83 Lung/Heart Ratio (Normal <0.45):  0.29  Quantitative Gated Spect Images QGS EDV:  n/a QGS ESV:  n/a  Impression Exercise Capacity:  Fair exercise capacity. BP Response:  Normal blood pressure response. Clinical Symptoms:  Short of breath, no chest pain.  ECG Impression:  No significant ST segment change suggestive of ischemia.  In recovery, the patient went into atrial flutter and later atrial fibrillation.  It appears that he eventually went back into NSR.  Comparison with Prior Nuclear Study: No images to compare  Overall Impression:  There is no sign of scar or ischemia.  The patient went into atrial flutter, then atrial fibrillation in recovery.  He was in NSR at the time of discharge.   LV Ejection Fraction: Study not gated.  LV Wall Motion:  Study not gated  [employer] 05/19/2012

## 2012-05-23 ENCOUNTER — Telehealth: Payer: Self-pay

## 2012-05-23 NOTE — Telephone Encounter (Signed)
New Problem:    Patient called in to check on the status of setting him up to have a monitor placed.  Please call back.

## 2012-05-24 ENCOUNTER — Ambulatory Visit: Payer: Self-pay | Admitting: Cardiovascular Disease

## 2012-05-26 ENCOUNTER — Encounter (INDEPENDENT_AMBULATORY_CARE_PROVIDER_SITE_OTHER): Payer: Self-pay

## 2012-05-26 ENCOUNTER — Telehealth: Payer: Self-pay

## 2012-05-26 DIAGNOSIS — R079 Chest pain, unspecified: Secondary | ICD-10-CM

## 2012-05-26 DIAGNOSIS — R0989 Other specified symptoms and signs involving the circulatory and respiratory systems: Secondary | ICD-10-CM

## 2012-05-26 NOTE — Telephone Encounter (Signed)
PATIENT WILL  GET MONITOR MAIL OUT TO HIM IF HE APPROVE FOR THE HARDSHIP APPLICATION HE FILL OUT FROM [facility]

## 2012-07-18 ENCOUNTER — Telehealth: Payer: Self-pay | Admitting: *Deleted

## 2012-07-18 NOTE — Telephone Encounter (Signed)
Followed up ref : event monitor, [employer] advised he was inactivated d/t not returning calls to activate. TK

## 2014-05-04 ENCOUNTER — Other Ambulatory Visit (HOSPITAL_COMMUNITY): Payer: Self-pay | Admitting: Family Medicine

## 2014-05-04 ENCOUNTER — Ambulatory Visit (HOSPITAL_COMMUNITY)
Admission: RE | Admit: 2014-05-04 | Discharge: 2014-05-04 | Disposition: A | Discharge status: Home / Self Care | Payer: Disability Insurance | Source: Ambulatory Visit | Attending: Family Medicine | Admitting: Family Medicine

## 2014-05-04 DIAGNOSIS — M47817 Spondylosis without myelopathy or radiculopathy, lumbosacral region: Secondary | ICD-10-CM | POA: Diagnosis not present

## 2014-05-04 DIAGNOSIS — M5137 Other intervertebral disc degeneration, lumbosacral region: Secondary | ICD-10-CM | POA: Insufficient documentation

## 2014-05-04 DIAGNOSIS — M545 Low back pain, unspecified: Secondary | ICD-10-CM

## 2014-05-04 DIAGNOSIS — M79609 Pain in unspecified limb: Secondary | ICD-10-CM | POA: Diagnosis not present

## 2014-05-04 DIAGNOSIS — M51379 Other intervertebral disc degeneration, lumbosacral region without mention of lumbar back pain or lower extremity pain: Secondary | ICD-10-CM | POA: Insufficient documentation

## 2014-09-13 ENCOUNTER — Telehealth: Payer: Self-pay | Admitting: Family Medicine

## 2014-09-13 NOTE — Telephone Encounter (Signed)
Appointment scheduled for 3/10 at 9:10 with christy. Patient states he does not have insurance and that he is not on any medication. Patient aware that if he needs any pain medications that we will refer to pain clinic.

## 2014-10-24 ENCOUNTER — Ambulatory Visit: Payer: Self-pay | Admitting: Family

## 2015-08-19 ENCOUNTER — Ambulatory Visit: Payer: Self-pay | Admitting: Family Medicine

## 2015-08-26 ENCOUNTER — Ambulatory Visit: Payer: Self-pay | Admitting: Family Medicine

## 2015-08-27 ENCOUNTER — Encounter: Payer: Self-pay | Admitting: Family Medicine

## 2017-05-27 ENCOUNTER — Other Ambulatory Visit (HOSPITAL_COMMUNITY): Payer: Self-pay | Admitting: Physician Assistant

## 2017-05-27 DIAGNOSIS — M79672 Pain in left foot: Secondary | ICD-10-CM

## 2017-06-02 ENCOUNTER — Ambulatory Visit (HOSPITAL_COMMUNITY): Admission: RE | Admit: 2017-06-02 | Payer: Disability Insurance | Source: Ambulatory Visit

## 2017-06-04 ENCOUNTER — Encounter (HOSPITAL_COMMUNITY): Payer: Self-pay | Admitting: Emergency Medicine

## 2017-06-04 ENCOUNTER — Emergency Department (HOSPITAL_COMMUNITY)
Admission: EM | Admit: 2017-06-04 | Discharge: 2017-06-04 | Disposition: A | Discharge status: Home / Self Care | Payer: Self-pay | Attending: Emergency Medicine | Admitting: Emergency Medicine

## 2017-06-04 ENCOUNTER — Emergency Department (HOSPITAL_COMMUNITY): Payer: Self-pay

## 2017-06-04 DIAGNOSIS — Y999 Unspecified external cause status: Secondary | ICD-10-CM | POA: Insufficient documentation

## 2017-06-04 DIAGNOSIS — W208XXA Other cause of strike by thrown, projected or falling object, initial encounter: Secondary | ICD-10-CM | POA: Insufficient documentation

## 2017-06-04 DIAGNOSIS — Y9389 Activity, other specified: Secondary | ICD-10-CM | POA: Insufficient documentation

## 2017-06-04 DIAGNOSIS — S5012XA Contusion of left forearm, initial encounter: Secondary | ICD-10-CM | POA: Insufficient documentation

## 2017-06-04 DIAGNOSIS — Z23 Encounter for immunization: Secondary | ICD-10-CM | POA: Insufficient documentation

## 2017-06-04 DIAGNOSIS — Y9289 Other specified places as the place of occurrence of the external cause: Secondary | ICD-10-CM | POA: Insufficient documentation

## 2017-06-04 MED ORDER — IBUPROFEN 800 MG PO TABS
800.0000 mg | ORAL_TABLET | Freq: Once | ORAL | Status: AC
  Administered 2017-06-04: 800 mg via ORAL
  Filled 2017-06-04: qty 1

## 2017-06-04 MED ORDER — HYDROCODONE-ACETAMINOPHEN 5-325 MG PO TABS: 1.0000 | ORAL_TABLET | ORAL | 0 refills | Status: DC | PRN

## 2017-06-04 MED ORDER — ACETAMINOPHEN 325 MG PO TABS
650.0000 mg | ORAL_TABLET | Freq: Once | ORAL | Status: AC
  Administered 2017-06-04: 650 mg via ORAL
  Filled 2017-06-04: qty 2

## 2017-06-04 MED ORDER — IBUPROFEN 600 MG PO TABS: 600.0000 mg | ORAL_TABLET | Freq: Four times a day (QID) | ORAL | 0 refills | Status: DC

## 2017-06-04 MED ORDER — TETANUS-DIPHTH-ACELL PERTUSSIS 5-2.5-18.5 LF-MCG/0.5 IM SUSP
0.5000 mL | Freq: Once | INTRAMUSCULAR | Status: AC
  Administered 2017-06-04: 0.5 mL via INTRAMUSCULAR
  Filled 2017-06-04: qty 0.5

## 2017-06-04 MED ORDER — BACITRACIN-NEOMYCIN-POLYMYXIN 400-5-5000 EX OINT
TOPICAL_OINTMENT | CUTANEOUS | Status: AC
  Filled 2017-06-04: qty 1

## 2017-06-04 NOTE — ED Provider Notes (Signed)
[facility] EMERGENCY DEPARTMENT Provider Note   CSN: [id number] Arrival date & time: 06/04/17  1105     History   Chief Complaint Chief Complaint  Patient presents with  . Arm Injury    HPI Sean Mccarty is a 56 y.o. male.  Patient is a 56 year old male who presents to the emergency department with a complaint of left arm pain.  The patient states that he was cutting a tree.  He went to cut a specific limb, it fell with faster than he was anticipating and hit him on the left arm.  The patient had pain and swelling on last evening, but decided he would try to wait out see if the pain would get better.  Today he had pain lifting his arm, and he had pain using his arm.  He also noticed that he had scars and marks on his arm from where the limbs had scratched him and he came to the emergency department for evaluation.  The patient denies being on any anticoagulation medications.  He denies being hit in head or chest or pelvis or abdomen.  He has not had any recent operations or procedures involving the left upper extremity.  Patient is unsure of the date of his last tetanus.      Past Medical History:  Diagnosis Date  . Chest pain 05/11/2012  . Snoring 05/11/2012    Patient Active Problem List   Diagnosis Date Noted  . Chest pain 05/11/2012  . Snoring 05/11/2012    Past Surgical History:  Procedure Laterality Date  . BACK SURGERY    . TESTICLE SURGERY         Home Medications    Prior to Admission medications   Not on File    Family History History reviewed. No pertinent family history.  Social History Social History  Substance Use Topics  . Smoking status: Never Smoker  . Smokeless tobacco: Never Used  . Alcohol use No     Allergies   Patient has no known allergies.   Review of Systems Review of Systems  Constitutional: Negative for activity change.       All ROS Neg except as noted in HPI  HENT: Negative for nosebleeds.   Eyes: Negative for  photophobia and discharge.  Respiratory: Negative for cough, shortness of breath and wheezing.   Cardiovascular: Negative for chest pain and palpitations.  Gastrointestinal: Negative for abdominal pain and blood in stool.  Genitourinary: Negative for dysuria, frequency and hematuria.  Musculoskeletal: Positive for arthralgias. Negative for back pain and neck pain.  Skin: Negative.        abrasions  Neurological: Negative for dizziness, seizures and speech difficulty.  Psychiatric/Behavioral: Negative for confusion and hallucinations.     Physical Exam Updated Vital Signs BP (!) 154/86 (BP Location: Right Arm)   Pulse 74   Temp 98 F (36.7 C) (Oral)   Resp 16   Ht 5\' 10"  (1.778 m)   Wt 111.1 kg (245 lb)   SpO2 99%   BMI 35.15 kg/m   Physical Exam  Constitutional: He is oriented to person, place, and time. He appears well-developed and well-nourished.  Non-toxic appearance.  HENT:  Head: Normocephalic.  Right Ear: Tympanic membrane and external ear normal.  Left Ear: Tympanic membrane and external ear normal.  Eyes: Pupils are equal, round, and reactive to light. EOM and lids are normal.  Neck: Normal range of motion. Neck supple. Carotid bruit is not present.  Cardiovascular: Normal rate, regular  rhythm, normal heart sounds, intact distal pulses and normal pulses.   Pulmonary/Chest: Breath sounds normal. No respiratory distress.  Abdominal: Soft. Bowel sounds are normal. There is no tenderness. There is no guarding.  Musculoskeletal: Normal range of motion.       Left upper arm: He exhibits tenderness.  There is soreness noted of the left shoulder with attempted range of motion.  There is no deformity appreciated.  There is soreness of the bicep tricep area on the left.  There is no deformity of the elbow.  There is no evidence of effusion.  There are abrasions of the forearm on the left at the dorsal area.  There is full range of motion of the fingers of the left hand.  There  is good range of motion of the wrist, but with some discomfort.  Lymphadenopathy:       Head (right side): No submandibular adenopathy present.       Head (left side): No submandibular adenopathy present.    He has no cervical adenopathy.  Neurological: He is alert and oriented to person, place, and time. He has normal strength. No cranial nerve deficit or sensory deficit.  Skin: Skin is warm and dry.  Psychiatric: He has a normal mood and affect. His speech is normal.  Nursing note and vitals reviewed.    ED Treatments / Results  Labs (all labs ordered are listed, but only abnormal results are displayed) Labs Reviewed - No data to display  EKG  EKG Interpretation None       Radiology Dg Forearm Left  Result Date: 06/04/2017 CLINICAL DATA:  Tree limb fell on upper extremity EXAM: LEFT FOREARM - 2 VIEW COMPARISON:  None. FINDINGS: Frontal and lateral views were obtained. No fracture or dislocation. No abnormal periosteal reaction. Joint spaces appear normal. No erosive change. No radiopaque foreign body. There is soft tissue swelling. IMPRESSION: Soft tissue swelling. No radiopaque foreign body. No fracture or dislocation. No appreciable arthropathic change. Electronically Signed   By: Bretta Bang III M.D.   On: 06/04/2017 11:59   Dg Hand Complete Left  Result Date: 06/04/2017 CLINICAL DATA:  Tree limb fell on upper extremity EXAM: LEFT HAND - COMPLETE 3+ VIEW COMPARISON:  None. FINDINGS: Frontal, oblique, and lateral views were obtained. There is no appreciable fracture or dislocation. Joint spaces appear unremarkable. No erosive change. IMPRESSION: No fracture or dislocation.  No evident arthropathy. Electronically Signed   By: Bretta Bang III M.D.   On: 06/04/2017 11:58    Procedures Procedures (including critical care time)  Medications Ordered in ED Medications  Tdap (BOOSTRIX) injection 0.5 mL (not administered)  ibuprofen (ADVIL,MOTRIN) tablet 800 mg (not  administered)  acetaminophen (TYLENOL) tablet 650 mg (not administered)     Initial Impression / Assessment and Plan / ED Course  I have reviewed the triage vital signs and the nursing notes.  Pertinent labs & imaging results that were available during my care of the patient were reviewed by me and considered in my medical decision making (see chart for details).       Final Clinical Impressions(s) / ED Diagnoses MDM Patient has abrasions of the left forearm.  Patient's tetanus status was updated.  Neosporin dressing was applied.  The patient has pain and soreness from the shoulder to the wrist and hand.  X-ray of the left hand is negative for fracture, dislocation, or foreign body.  X-ray of the forearm is negative for fracture, dislocation or foreign body or air.  Patient will be treated with ibuprofen and Norco for pain.  The patient is asked to cleanse the wounds daily with soap and water and to apply dressing until the wounds have healed.  The patient will see his primary physician or return to the emergency department if any changes, problems, or concerns.   Final diagnoses:  Contusion of left lower arm, initial encounter    New Prescriptions New Prescriptions   HYDROCODONE-ACETAMINOPHEN (NORCO/VICODIN) 5-325 MG TABLET    Take 1 tablet by mouth every 4 (four) hours as needed.   IBUPROFEN (ADVIL,MOTRIN) 600 MG TABLET    Take 1 tablet (600 mg total) by mouth 4 (four) times daily.     Ivery Quale, PA-C 06/04/17 1346    Samuel Jester, DO 06/08/17 [phone]

## 2017-06-04 NOTE — Discharge Instructions (Signed)
Your x-ray is negative for fracture or dislocation.  No foreign body was noted on the x-ray films at this time.  Your nerve and neurologic function as well as your vascular function all seem to be intact.  Your examination suggest a deep bruise and abrasions.  Please cleanse the wounds on your arm with soap and water daily and apply Neosporin until they have healed.  Please use ibuprofen with breakfast, lunch, dinner, and at bedtime.  May use Norco for more severe pain.This medication may cause drowsiness. Please do not drink, drive, or participate in activity that requires concentration while taking this medication.  Please see your physician or return to the emergency department if any changes, problems, or concerns.

## 2017-06-04 NOTE — ED Notes (Signed)
Pt taken to xray 

## 2017-06-04 NOTE — ED Triage Notes (Signed)
Patient states he was cutting a tree when a limb fell on his left arm yesterday. Patient has dried blood and scratches on left lower arm at triage.

## 2019-04-26 IMAGING — DX DG HAND COMPLETE 3+V*L*
3 series · 3 of 3 positions shown · non-contrast
Comparison: None.

CLINICAL DATA: Tree limb fell on upper extremity

EXAM:
LEFT HAND - COMPLETE 3+ VIEW

[hand pa]
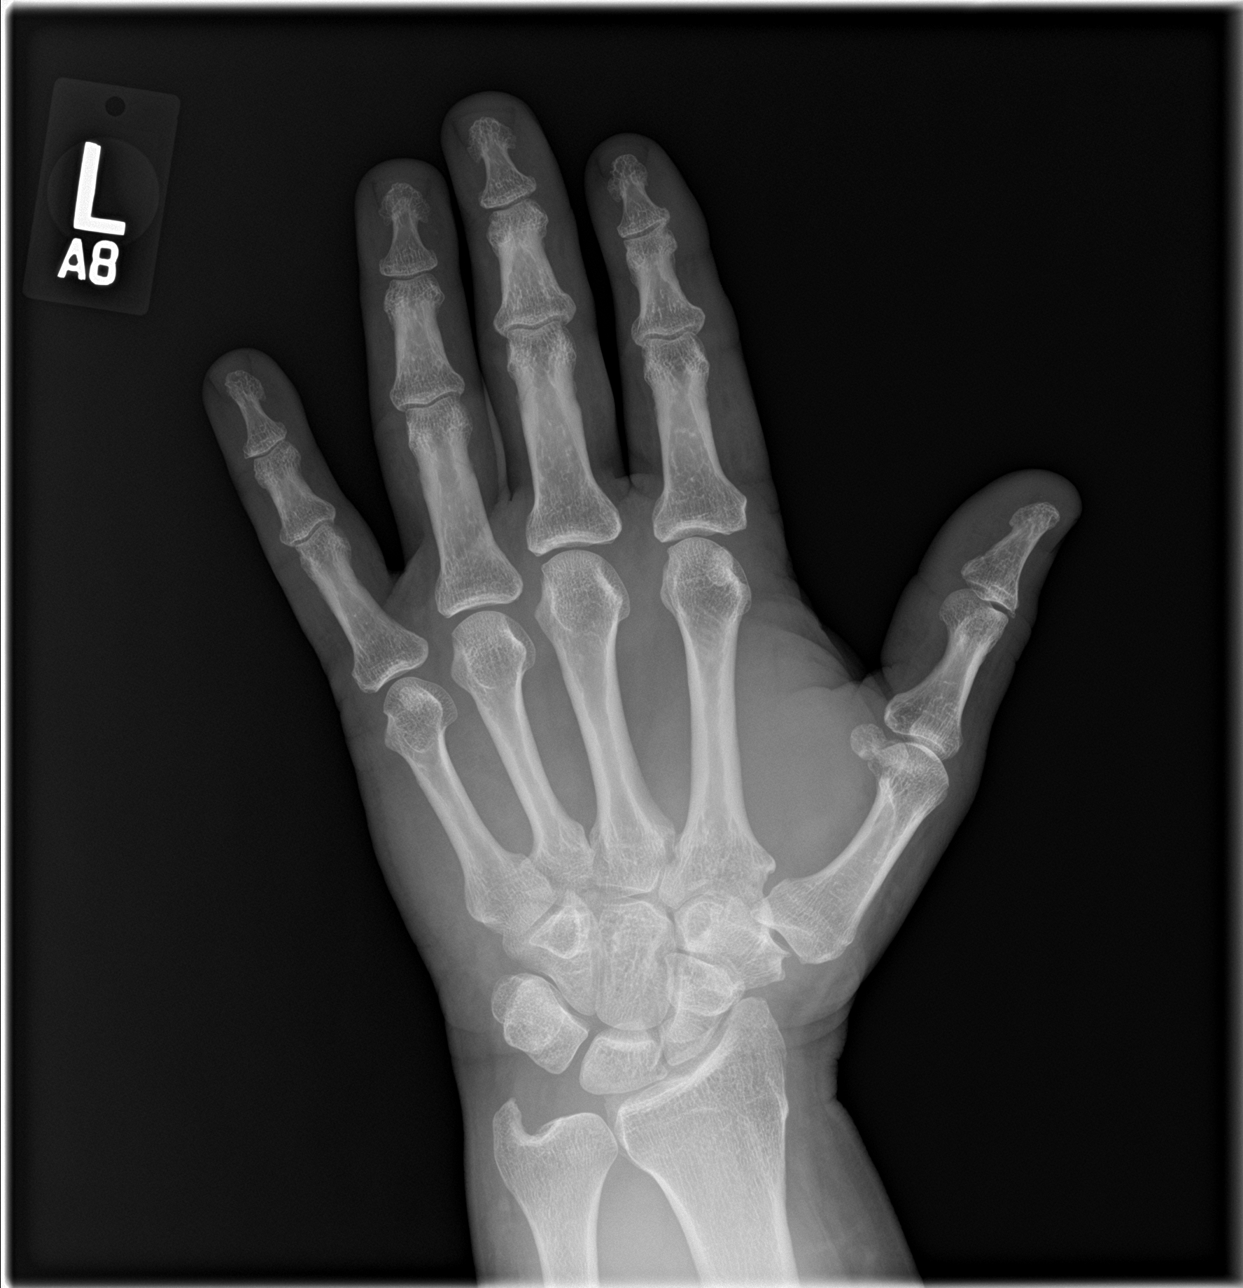

[hand obl]
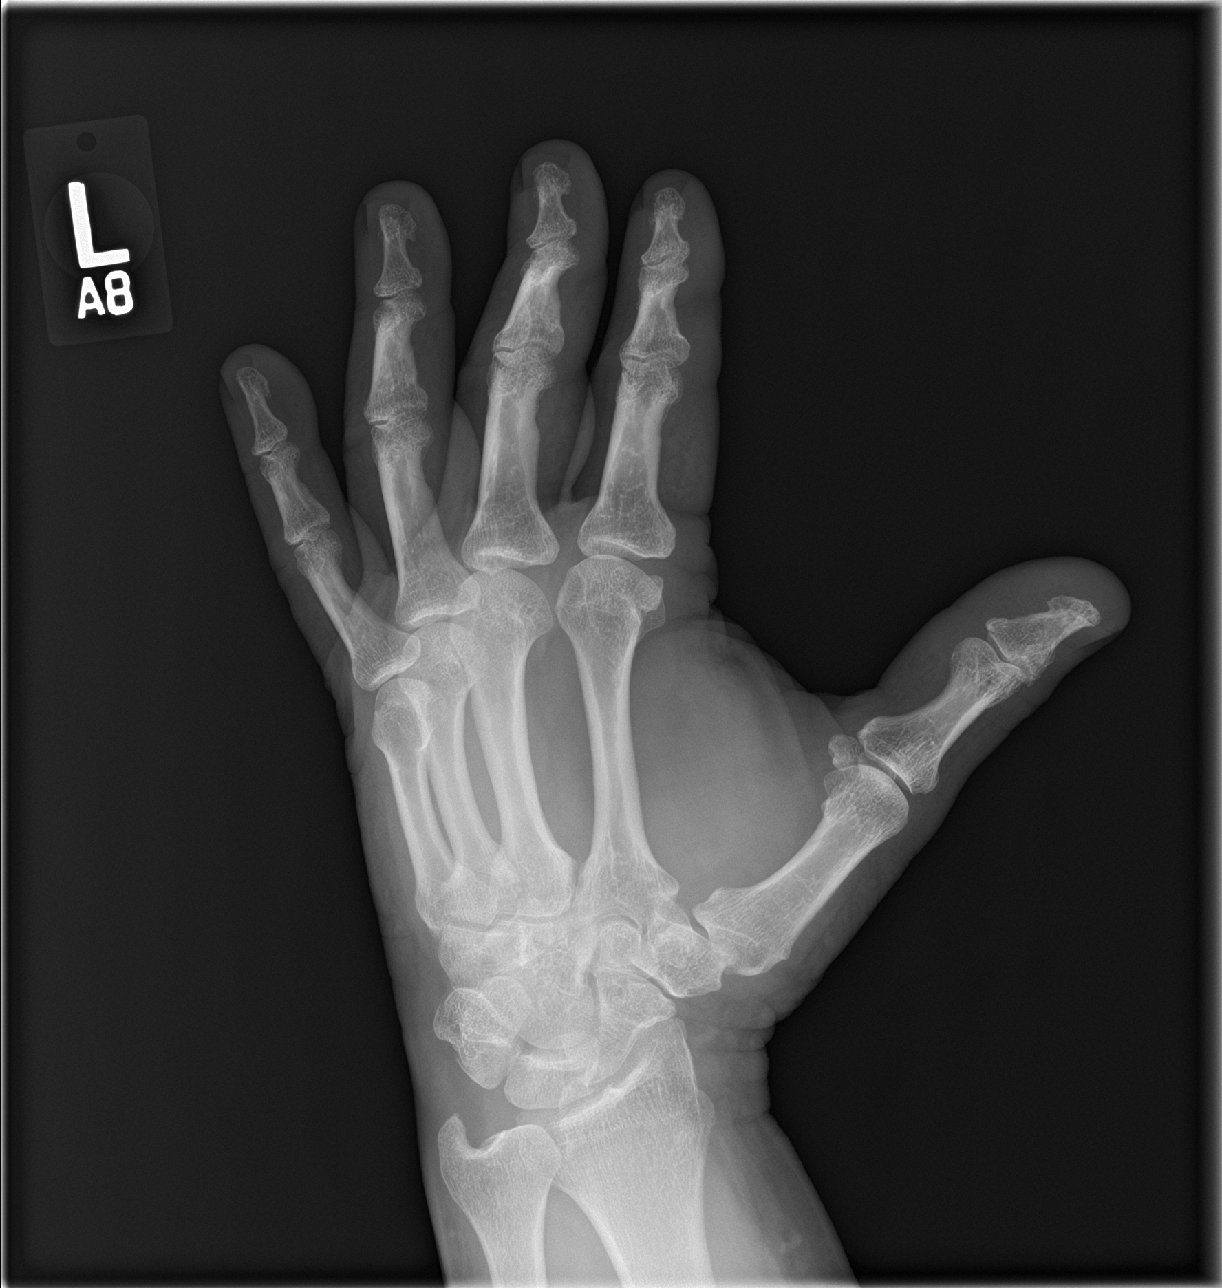

[hand lat]
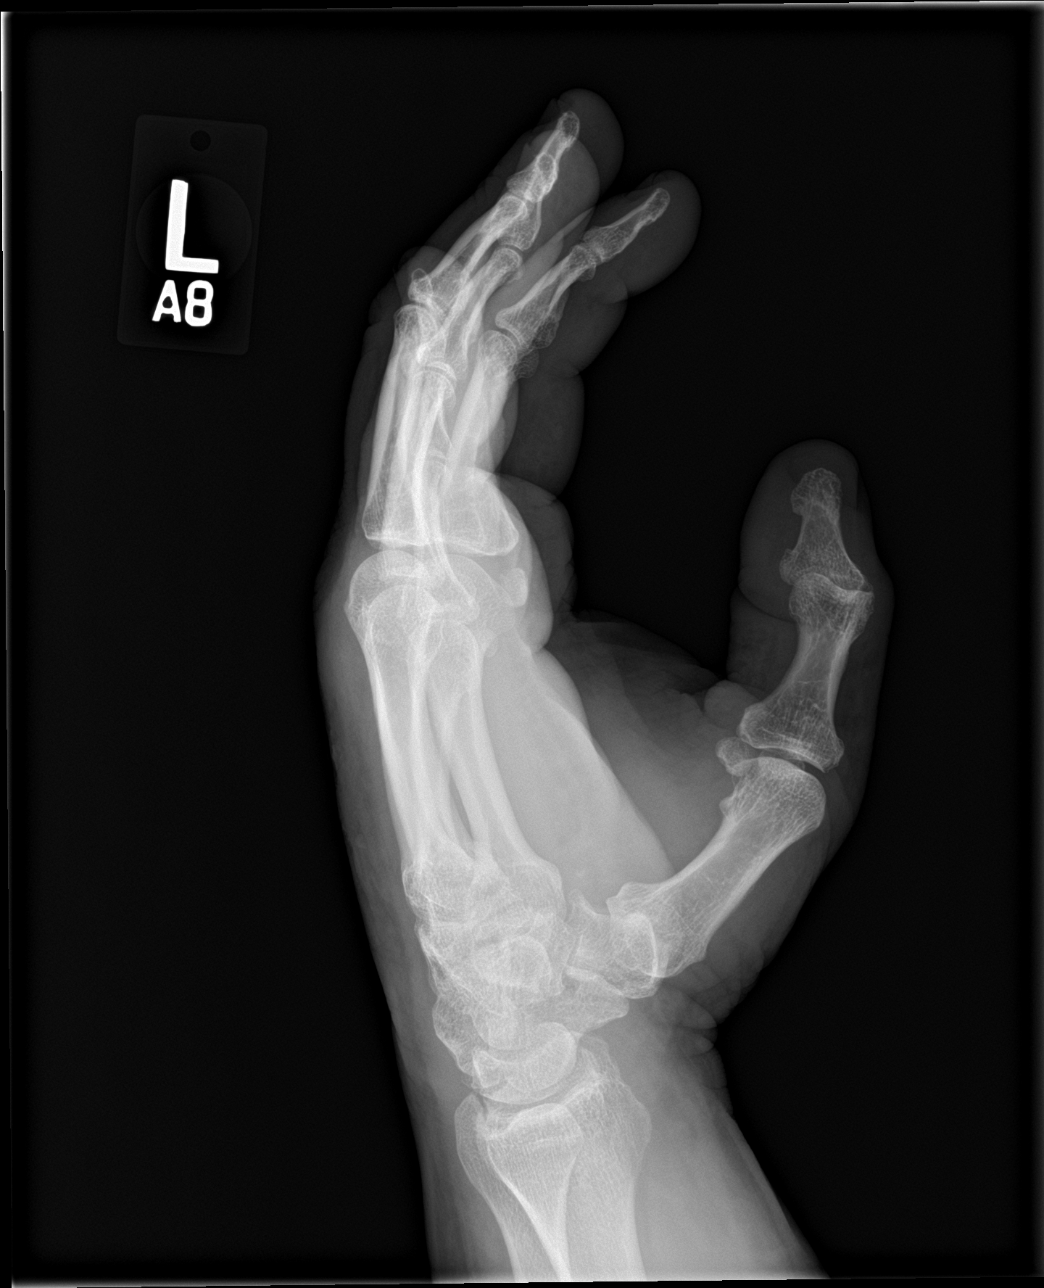

[3 of 3 positions shown; findings below may reference images not displayed]

FINDINGS: Frontal, oblique, and lateral views were obtained. There is no
appreciable fracture or dislocation. Joint spaces appear
unremarkable. No erosive change.
IMPRESSION: No fracture or dislocation.  No evident arthropathy.

## 2022-11-09 ENCOUNTER — Ambulatory Visit (INDEPENDENT_AMBULATORY_CARE_PROVIDER_SITE_OTHER): Payer: Self-pay | Admitting: Internal Medicine

## 2022-11-09 ENCOUNTER — Encounter: Payer: Self-pay | Admitting: Internal Medicine

## 2022-11-09 VITALS — BP 146/89 | HR 83 | Resp 16 | Ht 69.0 in | Wt 285.0 lb

## 2022-11-09 DIAGNOSIS — R600 Localized edema: Secondary | ICD-10-CM | POA: Insufficient documentation

## 2022-11-09 DIAGNOSIS — Z1211 Encounter for screening for malignant neoplasm of colon: Secondary | ICD-10-CM | POA: Insufficient documentation

## 2022-11-09 DIAGNOSIS — Z0001 Encounter for general adult medical examination with abnormal findings: Secondary | ICD-10-CM | POA: Insufficient documentation

## 2022-11-09 DIAGNOSIS — E785 Hyperlipidemia, unspecified: Secondary | ICD-10-CM | POA: Insufficient documentation

## 2022-11-09 DIAGNOSIS — I10 Essential (primary) hypertension: Secondary | ICD-10-CM | POA: Insufficient documentation

## 2022-11-09 NOTE — Assessment & Plan Note (Signed)
No family history of colon cancer.  Patient is age 62 never had screening for colon cancer. Cologuard

## 2022-11-09 NOTE — Assessment & Plan Note (Signed)
Patient is currently on amlodipine 2.5 mg, HCTZ 12.5 mg, losartan 25 mg. He did not take medication this morning. His BP Is 146/89.  He has unilateral swelling in his left leg and not associated with starting amlodipine.   Follow up in 2 weeks for BP check, take antihypertensives before visit Continue current regimen

## 2022-11-09 NOTE — Assessment & Plan Note (Signed)
BMI 42 Counseled on diet and exercise Check hemoglobin A1c, TSH, lipid panel, and CMP

## 2022-11-09 NOTE — Patient Instructions (Addendum)
Thank you, Sean Mccarty for allowing [place] to provide your care today.   I have ordered the following labs for you:   Lab Orders         VITAMIN D 25 Hydroxy (Vit-D Deficiency, Fractures)         Lipid panel         CMP14+EGFR         CBC with Differential/Platelet         TSH         Hemoglobin A1c         HIV Antibody (routine testing w rflx)         Hepatitis C Antibody         Cologuard      Reminders: Sign release forms at front desk for  hospital records.                       Come back tomorrow morning or a morning soon for fasting labs             Pick up compression stockings at [facility]                       Follow up in 2 weeks for a blood pressure check while taking medication daily.                 Tamsen Snider, M.D.

## 2022-11-09 NOTE — Assessment & Plan Note (Signed)
Obtain records from [facility] so I can review previous studies Wear compression stocking daily on left leg Can continue normal activity and elevate leg when resting Follow up in 2 weeks

## 2022-11-09 NOTE — Assessment & Plan Note (Signed)
Currently on atorvastatin 40 mg daily Lipid Panel

## 2022-11-09 NOTE — Assessment & Plan Note (Addendum)
Establishing care, check baseline CBC One-time screening for HIV and hepatitis C

## 2022-11-09 NOTE — Progress Notes (Signed)
    HPI:Sean Mccarty is a 62 y.o. male with HTN, HLD, prediabetes, and admission in February for left leg cellulitis who presents to establish care. His left leg continues to have edema. He was evaluated in [facility] at [facility].  He had an ultrasound and a CT of his leg but I do not have access to reports. His leg is edematous today, but it looks better than it has looked previously. He is elevated his leg and was told to rest. He has not fevers or chills. He has not taken his antihypertensives today. He has never had colon cancer screening.     Past Medical History:  Diagnosis Date   Chest pain 05/11/2012   HTN (hypertension)    Hyperlipidemia    Prediabetes    Snoring 05/11/2012    Past Surgical History:  Procedure Laterality Date   BACK SURGERY     TESTICLE SURGERY      No family history on file.  Social History   Tobacco Use   Smoking status: Never   Smokeless tobacco: Never  Substance Use Topics   Alcohol use: No   Drug use: No    Physical Exam: Vitals:   11/09/22 1342  BP: (!) 146/89  Pulse: 83  Resp: 16  SpO2: 95%  Weight: 285 lb (129.3 kg)  Height: 5\' 9"  (1.753 m)     Physical Exam Constitutional:      General: He is not in acute distress.    Appearance: He is well-developed and well-groomed. He is morbidly obese.  Eyes:     General: No scleral icterus.    Conjunctiva/sclera: Conjunctivae normal.  Cardiovascular:     Rate and Rhythm: Normal rate and regular rhythm.     Heart sounds: No murmur heard.    No friction rub. No gallop.  Pulmonary:     Effort: Pulmonary effort is normal.     Breath sounds: No wheezing, rhonchi or rales.  Musculoskeletal:        General: No tenderness.     Right lower leg: No edema.     Left lower leg: Edema present.  Skin:    General: Skin is warm and dry.     Capillary Refill: Capillary refill takes less than 2 seconds.     Findings: No erythema.      Assessment & Plan:   Morbid  obesity BMI 42 Counseled on diet and exercise Check hemoglobin A1c, TSH, lipid panel, and CMP  Encounter for general adult medical examination with abnormal findings Establishing care, check baseline CBC One-time screening for HIV and hepatitis C  Colon cancer screening No family history of colon cancer.  Patient is age 54 never had screening for colon cancer. Cologuard  Edema of left lower extremity Obtain records from [facility] so I can review previous studies Wear compression stocking daily on left leg Can continue normal activity and elevate leg when resting Follow up in 2 weeks  HTN (hypertension) Patient is currently on amlodipine 2.5 mg, HCTZ 12.5 mg, losartan 25 mg. He did not take medication this morning. His BP Is 146/89.  He has unilateral swelling in his left leg and not associated with starting amlodipine.   Follow up in 2 weeks for BP check, take antihypertensives before visit Continue current regimen    Lorene Dy, MD

## 2022-11-11 LAB — VITAMIN D 25 HYDROXY (VIT D DEFICIENCY, FRACTURES): Vit D, 25-Hydroxy: 15.5 ng/mL — ABNORMAL LOW (ref 30.0–100.0)

## 2022-11-11 LAB — CMP14+EGFR
ALT: 21 IU/L (ref 0–44)
AST: 21 IU/L (ref 0–40)
Albumin/Globulin Ratio: 1.5 (ref 1.2–2.2)
Albumin: 4 g/dL (ref 3.9–4.9)
Alkaline Phosphatase: 46 IU/L (ref 44–121)
BUN/Creatinine Ratio: 15 (ref 10–24)
BUN: 15 mg/dL (ref 8–27)
Bilirubin Total: 1 mg/dL (ref 0.0–1.2)
CO2: 20 mmol/L (ref 20–29)
Calcium: 9.1 mg/dL (ref 8.6–10.2)
Chloride: 103 mmol/L (ref 96–106)
Creatinine, Ser: 1.03 mg/dL (ref 0.76–1.27)
Globulin, Total: 2.6 g/dL (ref 1.5–4.5)
Glucose: 106 mg/dL — ABNORMAL HIGH (ref 70–99)
Potassium: 4.1 mmol/L (ref 3.5–5.2)
Sodium: 138 mmol/L (ref 134–144)
Total Protein: 6.6 g/dL (ref 6.0–8.5)
eGFR: 82 mL/min/{1.73_m2} (ref >59)

## 2022-11-11 LAB — CBC WITH DIFFERENTIAL/PLATELET
Basophils Absolute: 0 10*3/uL (ref 0.0–0.2)
Basos: 1 %
EOS (ABSOLUTE): 0.2 10*3/uL (ref 0.0–0.4)
Eos: 4 %
Hematocrit: 44.6 % (ref 37.5–51.0)
Hemoglobin: 14.5 g/dL (ref 13.0–17.7)
Immature Grans (Abs): 0 10*3/uL (ref 0.0–0.1)
Immature Granulocytes: 0 %
Lymphocytes Absolute: 2.3 10*3/uL (ref 0.7–3.1)
Lymphs: 49 %
MCH: 28.7 pg (ref 26.6–33.0)
MCHC: 32.5 g/dL (ref 31.5–35.7)
MCV: 88 fL (ref 79–97)
Monocytes Absolute: 0.5 10*3/uL (ref 0.1–0.9)
Monocytes: 9 %
Neutrophils Absolute: 1.8 10*3/uL (ref 1.4–7.0)
Neutrophils: 37 %
Platelets: 170 10*3/uL (ref 150–450)
RBC: 5.05 x10E6/uL (ref 4.14–5.80)
RDW: 14 % (ref 11.6–15.4)
WBC: 4.8 10*3/uL (ref 3.4–10.8)

## 2022-11-11 LAB — LIPID PANEL
Chol/HDL Ratio: 4.2 ratio (ref 0.0–5.0)
Cholesterol, Total: 156 mg/dL (ref 100–199)
HDL: 37 mg/dL — ABNORMAL LOW (ref >39)
LDL Chol Calc (NIH): 98 mg/dL (ref 0–99)
Triglycerides: 113 mg/dL (ref 0–149)
VLDL Cholesterol Cal: 21 mg/dL (ref 5–40)

## 2022-11-11 LAB — TSH: TSH: 2.79 u[IU]/mL (ref 0.450–4.500)

## 2022-11-11 LAB — HEPATITIS C ANTIBODY: Hep C Virus Ab: NONREACTIVE

## 2022-11-11 LAB — HEMOGLOBIN A1C
Est. average glucose Bld gHb Est-mCnc: 128 mg/dL
Hgb A1c MFr Bld: 6.1 % — ABNORMAL HIGH (ref 4.8–5.6)

## 2022-11-11 LAB — HIV ANTIBODY (ROUTINE TESTING W REFLEX): HIV Screen 4th Generation wRfx: NONREACTIVE

## 2022-11-12 ENCOUNTER — Other Ambulatory Visit: Payer: Self-pay | Admitting: Internal Medicine

## 2022-11-12 MED ORDER — VITAMIN D3 25 MCG (1000 UT) PO CAPS: 1000.0000 [IU] | ORAL_CAPSULE | Freq: Every day | ORAL | 1 refills | Status: AC

## 2022-11-12 NOTE — Progress Notes (Signed)
Reviewed labs with patient. He will start Vitamin D supplement. Stop Metformin 500 mg.

## 2022-11-26 ENCOUNTER — Ambulatory Visit: Payer: Self-pay | Admitting: Internal Medicine

## 2022-12-16 ENCOUNTER — Ambulatory Visit (INDEPENDENT_AMBULATORY_CARE_PROVIDER_SITE_OTHER): Payer: Self-pay | Admitting: Internal Medicine

## 2022-12-16 ENCOUNTER — Encounter: Payer: Self-pay | Admitting: Internal Medicine

## 2022-12-16 VITALS — BP 138/84 | HR 79 | Ht 69.0 in | Wt 294.0 lb

## 2022-12-16 DIAGNOSIS — I10 Essential (primary) hypertension: Secondary | ICD-10-CM

## 2022-12-16 DIAGNOSIS — R600 Localized edema: Secondary | ICD-10-CM

## 2022-12-16 MED ORDER — AMLODIPINE BESYLATE 2.5 MG PO TABS: 2.5000 mg | ORAL_TABLET | Freq: Every day | ORAL | 2 refills | Status: DC

## 2022-12-16 MED ORDER — ATORVASTATIN CALCIUM 40 MG PO TABS: 40.0000 mg | ORAL_TABLET | Freq: Every day | ORAL | 1 refills | Status: DC

## 2022-12-16 MED ORDER — FUROSEMIDE 20 MG PO TABS
20.0000 mg | ORAL_TABLET | Freq: Every day | ORAL | 3 refills | Status: DC | PRN
Start: 2022-12-16 — End: 2023-03-22

## 2022-12-16 MED ORDER — LOSARTAN POTASSIUM 25 MG PO TABS: 25.0000 mg | ORAL_TABLET | Freq: Every day | ORAL | 2 refills | Status: DC

## 2022-12-16 MED ORDER — HYDROCHLOROTHIAZIDE 12.5 MG PO CAPS
12.5000 mg | ORAL_CAPSULE | Freq: Every day | ORAL | 2 refills | Status: DC
Start: 2022-12-16 — End: 2023-01-18

## 2022-12-16 NOTE — Assessment & Plan Note (Signed)
Persistent issue that began in late February at which time he was admitted to a hospital in [place], [place] for treatment of cellulitis.  Indurated skin is appreciated along the posterior aspect of the left leg.  He is concerned about the persistence of edema, though he endorses that it improves overnight but abruptly returned each morning.  I suspect persistent edema is attributable to venous insufficiency, though the unilaterality is concerning. -Vascular ultrasound of the left leg ordered today -Recommended compression stocking use when out of bed -Lasix 20 mg as needed for edema prescribed today -Follow-up in 2-4 weeks for reassessment

## 2022-12-16 NOTE — Assessment & Plan Note (Signed)
Presenting today for HTN follow-up.  BP remains elevated today, 149/87 initially and 138/84 on repeat.  He is out of all antihypertensive medications. -Antihypertensive medications refilled -Follow-up in 2-4 weeks for HTN check

## 2022-12-16 NOTE — Patient Instructions (Signed)
It was a pleasure to see you today.  Thank you for giving [place] the opportunity to be involved in your care.  Below is a brief recap of your visit and next steps.  We will plan to see you again in 2-4 weeks.  Summary BP medications refilled Add lasix 20 mg daily as needed for swelling relief Wear compression stocking when out of bed Check vascular ultrasound

## 2022-12-16 NOTE — Progress Notes (Signed)
Established Patient Office Visit  Subjective   Patient ID: Sean Mccarty, male    DOB: [birth year]  Age: 62 y.o. MRN: [id number]  Chief Complaint  Patient presents with   Edema    Follow up   Hypertension    Follow up   Mr. Bulow returns to care today for HTN follow-up as well as reevaluation of left lower extremity edema.  Last evaluated at [facility] on 4/1 by Dr. Barbaraann Faster.  His blood pressure was elevated at that time and he reported that he had not taken his medications prior to his appointment.  2-week follow-up was arranged for BP check.  Mr. Lubeck was admitted to [facility] in [place], [place] in late February for treatment of cellulitis of the left lower extremity.  Since that time he has experienced persistent swelling in the left leg.  He is concerned because the posterior aspect of the left leg has become firm.  Edema improves with leg elevation, but abruptly returns each morning.  He is concerned for DVT given the persistence of edema.  He additionally endorses pain in his calf with ambulation.  Past Medical History:  Diagnosis Date   Chest pain 05/11/2012   HTN (hypertension)    Hyperlipidemia    Prediabetes    Snoring 05/11/2012   Past Surgical History:  Procedure Laterality Date   BACK SURGERY     TESTICLE SURGERY     Social History   Tobacco Use   Smoking status: Never   Smokeless tobacco: Never  Substance Use Topics   Alcohol use: No   Drug use: No   History reviewed. No pertinent family history. No Known Allergies  Review of Systems  Cardiovascular:  Positive for leg swelling (Left leg).  All other systems reviewed and are negative.    Objective:     BP 138/84   Pulse 79   Ht 5\' 9"  (1.753 m)   Wt 294 lb (133.4 kg)   SpO2 94%   BMI 43.42 kg/m  BP Readings from Last 3 Encounters:  12/16/22 138/84  11/09/22 (!) 146/89  06/04/17 (!) 148/88   Physical Exam Vitals reviewed.  Constitutional:      General: He is not in acute distress.    Appearance:  Normal appearance. He is obese. He is not ill-appearing.  HENT:     Head: Normocephalic and atraumatic.     Right Ear: External ear normal.     Left Ear: External ear normal.     Nose: Nose normal. No congestion or rhinorrhea.     Mouth/Throat:     Mouth: Mucous membranes are moist.     Pharynx: Oropharynx is clear.  Eyes:     General: No scleral icterus.    Extraocular Movements: Extraocular movements intact.     Conjunctiva/sclera: Conjunctivae normal.     Pupils: Pupils are equal, round, and reactive to light.  Cardiovascular:     Rate and Rhythm: Normal rate and regular rhythm.     Pulses: Normal pulses.     Heart sounds: Normal heart sounds. No murmur heard. Pulmonary:     Effort: Pulmonary effort is normal.     Breath sounds: Normal breath sounds. No wheezing, rhonchi or rales.  Abdominal:     General: Abdomen is flat. Bowel sounds are normal. There is no distension.     Palpations: Abdomen is soft.     Tenderness: There is no abdominal tenderness.  Musculoskeletal:        General: No swelling  or deformity. Normal range of motion.     Cervical back: Normal range of motion.     Left lower leg: Edema (Nonpitting) present.  Skin:    General: Skin is warm and dry.     Capillary Refill: Capillary refill takes less than 2 seconds.     Findings: No erythema.  Neurological:     General: No focal deficit present.     Mental Status: He is alert and oriented to person, place, and time.     Motor: No weakness.  Psychiatric:        Mood and Affect: Mood normal.        Behavior: Behavior normal.        Thought Content: Thought content normal.   Last CBC Lab Results  Component Value Date   WBC 4.8 11/10/2022   HGB 14.5 11/10/2022   HCT 44.6 11/10/2022   MCV 88 11/10/2022   MCH 28.7 11/10/2022   RDW 14.0 11/10/2022   PLT 170 11/10/2022   Last metabolic panel Lab Results  Component Value Date   GLUCOSE 106 (H) 11/10/2022   NA 138 11/10/2022   K 4.1 11/10/2022   CL 103  11/10/2022   CO2 20 11/10/2022   BUN 15 11/10/2022   CREATININE 1.03 11/10/2022   EGFR 82 11/10/2022   CALCIUM 9.1 11/10/2022   PROT 6.6 11/10/2022   ALBUMIN 4.0 11/10/2022   LABGLOB 2.6 11/10/2022   AGRATIO 1.5 11/10/2022   BILITOT 1.0 11/10/2022   ALKPHOS 46 11/10/2022   AST 21 11/10/2022   ALT 21 11/10/2022   Last lipids Lab Results  Component Value Date   CHOL 156 11/10/2022   HDL 37 (L) 11/10/2022   LDLCALC 98 11/10/2022   TRIG 113 11/10/2022   CHOLHDL 4.2 11/10/2022   Last hemoglobin A1c Lab Results  Component Value Date   HGBA1C 6.1 (H) 11/10/2022   Last thyroid functions Lab Results  Component Value Date   TSH 2.790 11/10/2022   Last vitamin D Lab Results  Component Value Date   VD25OH 15.5 (L) 11/10/2022   The 10-year ASCVD risk score (Arnett DK, et al., 2019) is: 14.4%    Assessment & Plan:   Problem List Items Addressed This Visit       HTN (hypertension)    Presenting today for HTN follow-up.  BP remains elevated today, 149/87 initially and 138/84 on repeat.  He is out of all antihypertensive medications. -Antihypertensive medications refilled -Follow-up in 2-4 weeks for HTN check      Edema of left lower extremity - Primary    Persistent issue that began in late February at which time he was admitted to a hospital in [place], [place] for treatment of cellulitis.  Indurated skin is appreciated along the posterior aspect of the left leg.  He is concerned about the persistence of edema, though he endorses that it improves overnight but abruptly returned each morning.  I suspect persistent edema is attributable to venous insufficiency, though the unilaterality is concerning. -Vascular ultrasound of the left leg ordered today -Recommended compression stocking use when out of bed -Lasix 20 mg as needed for edema prescribed today -Follow-up in 2-4 weeks for reassessment       Return in about 2 weeks (around 12/30/2022) for HTN, LLE edema.    Billie Lade, MD

## 2022-12-17 ENCOUNTER — Ambulatory Visit (HOSPITAL_COMMUNITY)
Admission: RE | Admit: 2022-12-17 | Discharge: 2022-12-17 | Disposition: A | Discharge status: Home / Self Care | Payer: Medicaid Other | Source: Ambulatory Visit | Attending: Internal Medicine | Admitting: Internal Medicine

## 2022-12-17 DIAGNOSIS — R600 Localized edema: Secondary | ICD-10-CM | POA: Insufficient documentation

## 2022-12-29 ENCOUNTER — Ambulatory Visit: Payer: Self-pay | Admitting: Internal Medicine

## 2023-01-12 ENCOUNTER — Ambulatory Visit: Payer: Self-pay | Admitting: Internal Medicine

## 2023-01-18 ENCOUNTER — Ambulatory Visit: Payer: Medicaid Other | Admitting: Internal Medicine

## 2023-01-18 ENCOUNTER — Encounter: Payer: Self-pay | Admitting: Internal Medicine

## 2023-01-18 VITALS — BP 123/73 | HR 83 | Ht 69.0 in | Wt 303.6 lb

## 2023-01-18 DIAGNOSIS — I10 Essential (primary) hypertension: Secondary | ICD-10-CM

## 2023-01-18 DIAGNOSIS — R600 Localized edema: Secondary | ICD-10-CM

## 2023-01-18 MED ORDER — HYDROCHLOROTHIAZIDE 25 MG PO TABS
25.0000 mg | ORAL_TABLET | Freq: Every day | ORAL | 3 refills | Status: DC
Start: 2023-01-18 — End: 2024-04-25

## 2023-01-18 NOTE — Assessment & Plan Note (Addendum)
Presenting today for HTN follow-up.  BP was elevated at his last appointment, however he was out of all antihypertensive medications at that time.  He is currently prescribed antihypertensive regimen includes amlodipine 2.5 mg daily, HCTZ 12.5 mg daily, and losartan 25 mg daily.  BP today is 123/73. -Discontinue amlodipine in case this is contributing to persistent left lower extremity edema -Increase HCTZ to 25 mg daily -Follow-up in 3 months for BP check

## 2023-01-18 NOTE — Patient Instructions (Signed)
It was a pleasure to see you today.  Thank you for giving [place] the opportunity to be involved in your care.  Below is a brief recap of your visit and next steps.  We will plan to see you again in 3 months.  Summary Stop amlodipine Increase HCTZ 25 mg daily Follow up in 3 months

## 2023-01-18 NOTE — Progress Notes (Signed)
Established Patient Office Visit  Subjective   Patient ID: Sean Mccarty, male    DOB: August 02, 1961  Age: 62 y.o. MRN: [id number]  Chief Complaint  Patient presents with   Hypertension    Follow up   Sean Mccarty returns to care today for HTN follow-up.  He was last evaluated by me on 5/8 for HTN follow-up.  At that time he was out of all antihypertensive medications.  Medications were refilled and 2-4-week follow-up was arranged for HTN check.  He additionally endorsed persistent left lower extremity edema.  Vascular ultrasound of the left lower extremity was ordered.  I recommended that he use compression stockings.  Lasix 20 mg as needed for edema was also prescribed.  There have been no acute interval events.  Sean Mccarty reports feeling fairly well today.  He continues to endorse left lower extremity edema.  Symptoms have not improved, but also have not worsened.  He continues to deny orthopnea/PND and dyspnea on exertion.  He does not have any additional concerns to discuss today.   Past Medical History:  Diagnosis Date   Chest pain 05/11/2012   HTN (hypertension)    Hyperlipidemia    Prediabetes    Snoring 05/11/2012   Past Surgical History:  Procedure Laterality Date   BACK SURGERY     TESTICLE SURGERY     Social History   Tobacco Use   Smoking status: Never   Smokeless tobacco: Never  Substance Use Topics   Alcohol use: No   Drug use: No   History reviewed. No pertinent family history. No Known Allergies  Review of Systems  Cardiovascular:  Positive for leg swelling (LLE).  All other systems reviewed and are negative.    Objective:     BP 123/73   Pulse 83   Ht 5\' 9"  (1.753 m)   Wt (!) 303 lb 9.6 oz (137.7 kg)   SpO2 94%   BMI 44.83 kg/m  BP Readings from Last 3 Encounters:  01/18/23 123/73  12/16/22 138/84  11/09/22 (!) 146/89   Physical Exam Vitals reviewed.  Constitutional:      General: He is not in acute distress.    Appearance: Normal  appearance. He is obese. He is not ill-appearing.  HENT:     Head: Normocephalic and atraumatic.     Right Ear: External ear normal.     Left Ear: External ear normal.     Nose: Nose normal. No congestion or rhinorrhea.     Mouth/Throat:     Mouth: Mucous membranes are moist.     Pharynx: Oropharynx is clear.  Eyes:     General: No scleral icterus.    Extraocular Movements: Extraocular movements intact.     Conjunctiva/sclera: Conjunctivae normal.     Pupils: Pupils are equal, round, and reactive to light.  Cardiovascular:     Rate and Rhythm: Normal rate and regular rhythm.     Pulses: Normal pulses.     Heart sounds: Normal heart sounds. No murmur heard. Pulmonary:     Effort: Pulmonary effort is normal.     Breath sounds: Normal breath sounds. No wheezing, rhonchi or rales.  Abdominal:     General: Abdomen is flat. Bowel sounds are normal. There is no distension.     Palpations: Abdomen is soft.     Tenderness: There is no abdominal tenderness.  Musculoskeletal:        General: No swelling or deformity. Normal range of motion.  Cervical back: Normal range of motion.     Left lower leg: Edema (Nonpitting) present.  Skin:    General: Skin is warm and dry.     Capillary Refill: Capillary refill takes less than 2 seconds.     Findings: No erythema.  Neurological:     General: No focal deficit present.     Mental Status: He is alert and oriented to person, place, and time.     Motor: No weakness.  Psychiatric:        Mood and Affect: Mood normal.        Behavior: Behavior normal.        Thought Content: Thought content normal.   Last CBC Lab Results  Component Value Date   WBC 4.8 11/10/2022   HGB 14.5 11/10/2022   HCT 44.6 11/10/2022   MCV 88 11/10/2022   MCH 28.7 11/10/2022   RDW 14.0 11/10/2022   PLT 170 11/10/2022   Last metabolic panel Lab Results  Component Value Date   GLUCOSE 106 (H) 11/10/2022   NA 138 11/10/2022   K 4.1 11/10/2022   CL 103  11/10/2022   CO2 20 11/10/2022   BUN 15 11/10/2022   CREATININE 1.03 11/10/2022   EGFR 82 11/10/2022   CALCIUM 9.1 11/10/2022   PROT 6.6 11/10/2022   ALBUMIN 4.0 11/10/2022   LABGLOB 2.6 11/10/2022   AGRATIO 1.5 11/10/2022   BILITOT 1.0 11/10/2022   ALKPHOS 46 11/10/2022   AST 21 11/10/2022   ALT 21 11/10/2022   Last lipids Lab Results  Component Value Date   CHOL 156 11/10/2022   HDL 37 (L) 11/10/2022   LDLCALC 98 11/10/2022   TRIG 113 11/10/2022   CHOLHDL 4.2 11/10/2022   Last hemoglobin A1c Lab Results  Component Value Date   HGBA1C 6.1 (H) 11/10/2022   Last thyroid functions Lab Results  Component Value Date   TSH 2.790 11/10/2022   Last vitamin D Lab Results  Component Value Date   VD25OH 15.5 (L) 11/10/2022   The 10-year ASCVD risk score (Arnett DK, et al., 2019) is: 11.9%    Assessment & Plan:   Problem List Items Addressed This Visit       HTN (hypertension) - Primary    Presenting today for HTN follow-up.  BP was elevated at his last appointment, however he was out of all antihypertensive medications at that time.  He is currently prescribed antihypertensive regimen includes amlodipine 2.5 mg daily, HCTZ 12.5 mg daily, and losartan 25 mg daily.  BP today is 123/73. -Discontinue amlodipine in case this is contributing to persistent left lower extremity edema -Increase HCTZ to 25 mg daily -Follow-up in 3 months for BP check      Edema of left lower extremity    Persistent issue.  Recent vascular ultrasound was negative for DVT.  Edema began after he was treated for cellulitis earlier this year.  Edema has not worsened and he continues to deny associated symptoms. -Recommend continued use of compression stockings when out of bed and Lasix 20 mg as needed for significant edema. -Amlodipine has been discontinued today in case this is contributing to edema -Consider referral for lymphedema PT if symptoms persist at follow-up       Return in about 3  months (around 04/20/2023).    Billie Lade, MD

## 2023-01-18 NOTE — Assessment & Plan Note (Signed)
Persistent issue.  Recent vascular ultrasound was negative for DVT.  Edema began after he was treated for cellulitis earlier this year.  Edema has not worsened and he continues to deny associated symptoms. -Recommend continued use of compression stockings when out of bed and Lasix 20 mg as needed for significant edema. -Amlodipine has been discontinued today in case this is contributing to edema -Consider referral for lymphedema PT if symptoms persist at follow-up

## 2023-02-23 ENCOUNTER — Encounter: Payer: Self-pay | Admitting: Internal Medicine

## 2023-02-23 ENCOUNTER — Ambulatory Visit: Payer: Medicaid Other | Admitting: Internal Medicine

## 2023-02-23 ENCOUNTER — Ambulatory Visit (HOSPITAL_COMMUNITY)
Admission: RE | Admit: 2023-02-23 | Discharge: 2023-02-23 | Disposition: A | Discharge status: Home / Self Care | Payer: Medicaid Other | Source: Ambulatory Visit | Attending: Internal Medicine | Admitting: Internal Medicine

## 2023-02-23 VITALS — BP 143/77 | HR 85 | Ht 69.0 in | Wt 307.1 lb

## 2023-02-23 DIAGNOSIS — M79644 Pain in right finger(s): Secondary | ICD-10-CM

## 2023-02-23 MED ORDER — PREDNISONE 10 MG PO TABS: ORAL_TABLET | ORAL | 0 refills | Status: DC

## 2023-02-23 NOTE — Patient Instructions (Addendum)
Thank you, [name] for allowing [place] to provide your care today.   I have ordered the following labs for you:  Lab Orders         Uric acid         Reminders: Go to [facility] to have xray of your hand. Go by lab to check uric acid. I am going to start treatment for gout. Id like you to follow up if not improving.      Thurmon Fair, M.D.

## 2023-02-23 NOTE — Progress Notes (Signed)
   HPI:[name] is a 62 y.o. male who presents for evaluation of Hand Pain (Right pinky finger swollen and painful. Started 01/24/2023 and been getting worse.) . For the details of today's visit, please refer to the assessment and plan.  Physical Exam: Vitals:   02/23/23 1445 02/23/23 1446  BP: (!) 151/91 (!) 143/77  Pulse: 85   SpO2: 93%   Weight: (!) 307 lb 1.9 oz (139.3 kg)   Height: 5\' 9"  (1.753 m)      Physical Exam Constitutional:      General: He is not in acute distress.    Appearance: He is not ill-appearing or diaphoretic.  Cardiovascular:     Rate and Rhythm: Normal rate and regular rhythm.     Heart sounds: No murmur heard. Musculoskeletal:     Comments: Edema in right 5th finger, no erythema Tender to palpation of 5th finger, also pain with flexion, not localized at any joint.   Skin:    Findings: No erythema, lesion or rash.  Neurological:     Sensory: No sensory deficit.      Assessment & Plan:   Sean Mccarty was seen today for hand pain.  Pain of finger of right hand Assessment & Plan: Patient has non traumatic swelling of left 5th finger . Has had one gout attack previoulsy in great toe.  Examined with ultrasound and not significant fluid collection to perform arthrocentesis and fluid analysis Will send for xray to rule out fracture Start Prednisone with taper for gout . If not improving ask patient to follow up sooner. If improving   Orders: -     DG Hand Complete Right -     Uric acid  Other orders -     predniSONE; Take 4 tablets (40 mg total) by mouth daily with breakfast for 5 days, THEN 2 tablets (20 mg total) daily with breakfast for 2 days, THEN 1 tablet (10 mg total) daily with breakfast for 2 days, THEN 0.5 tablets (5 mg total) daily with breakfast for 2 days.  Dispense: 27 tablet; Refill: 0      Milus Banister, MD

## 2023-02-24 DIAGNOSIS — M79644 Pain in right finger(s): Secondary | ICD-10-CM | POA: Insufficient documentation

## 2023-02-24 LAB — URIC ACID: Uric Acid: 8 mg/dL (ref 3.8–8.4)

## 2023-02-24 NOTE — Assessment & Plan Note (Addendum)
Patient has non traumatic swelling of right 5th finger . Has had one gout attack previoulsy in great toe.  Examined with ultrasound and there is not a significant fluid collection to perform arthrocentesis and fluid analysis Will send for xray to rule out fracture Start Prednisone with taper for gout .  Follow up if symptoms worsen or fail to improve

## 2023-03-04 ENCOUNTER — Telehealth: Payer: Self-pay | Admitting: Internal Medicine

## 2023-03-04 MED ORDER — MELOXICAM 7.5 MG PO TABS
7.5000 mg | ORAL_TABLET | Freq: Every day | ORAL | 0 refills | Status: DC
Start: 2023-03-04 — End: 2024-04-25

## 2023-03-04 NOTE — Telephone Encounter (Signed)
Patient advised.

## 2023-03-04 NOTE — Telephone Encounter (Signed)
Called patient. Xray reviewed. No fracture. Not resolved with prednisone. I do not expect gout after no improvement. Likely tenosynovitis, will treat with Mobic. Patient will stop prednisone. Follow up if worsening or failure to improve.

## 2023-03-04 NOTE — Telephone Encounter (Signed)
Patient called waiting on results of his xrays still having right much pain and swelling. He has scheduled another appointment with our office due to still painful and swelling

## 2023-03-08 ENCOUNTER — Encounter: Payer: Self-pay | Admitting: Family Medicine

## 2023-03-08 ENCOUNTER — Ambulatory Visit: Payer: Medicaid Other | Admitting: Family Medicine

## 2023-03-08 VITALS — BP 118/74 | HR 92 | Ht 69.0 in | Wt 312.0 lb

## 2023-03-08 DIAGNOSIS — M79644 Pain in right finger(s): Secondary | ICD-10-CM | POA: Diagnosis not present

## 2023-03-08 DIAGNOSIS — M79641 Pain in right hand: Secondary | ICD-10-CM | POA: Diagnosis not present

## 2023-03-08 MED ORDER — GABAPENTIN 300 MG PO CAPS
300.0000 mg | ORAL_CAPSULE | Freq: Three times a day (TID) | ORAL | 3 refills | Status: AC | PRN
Start: 2023-03-08 — End: ?

## 2023-03-08 NOTE — Patient Instructions (Signed)
        Great to see you today.   - Please take medications as prescribed. - Follow up with your primary health provider if any health concerns arises. - If symptoms worsen please contact your primary care provider and/or visit the emergency department.  

## 2023-03-08 NOTE — Progress Notes (Signed)
New Patient Office Visit   Subjective   Patient ID: Sean Mccarty, male    DOB: Jul 13, 1961  Age: 62 y.o. MRN: [id number]  CC:  Chief Complaint  Patient presents with   Hand Pain    Patient complains of continued R finger pain and swelling.     HPI Sean Mccarty 62 year old male, presents to the clinic for Right hand, fifth finger pain worsening since last visit. He  has a past medical history of Chest pain (05/11/2012), HTN (hypertension), Hyperlipidemia, Prediabetes, and Snoring (05/11/2012).  Right Hand Pain   There was no injury mechanism. Patient reported gradual right hand pain 3 months ago and now worsening. The pain is present in the right hand and right fifth finger. The quality of the pain is described as burning and aching numbness, tingling. The pain does not radiate. The pain is at a severity of 10/10. The pain has been constant since onset. Associated symptoms include muscle weakness, numbness and tingling. Pertinent negatives include no chest pain. The symptoms are aggravated by movement. He has tried ice, NSAIDs, rest, acetaminophen and heat prednisone for the symptoms. The treatment provided no relief.        Outpatient Encounter Medications as of 03/08/2023  Medication Sig   atorvastatin (LIPITOR) 40 MG tablet Take 1 tablet (40 mg total) by mouth daily.   Cholecalciferol (VITAMIN D3) 25 MCG (1000 UT) capsule Take 1 capsule (1,000 Units total) by mouth daily.   furosemide (LASIX) 20 MG tablet Take 1 tablet (20 mg total) by mouth daily as needed for edema.   gabapentin (NEURONTIN) 300 MG capsule Take 1 capsule (300 mg total) by mouth 3 (three) times daily as needed.   hydrochlorothiazide (HYDRODIURIL) 25 MG tablet Take 1 tablet (25 mg total) by mouth daily.   losartan (COZAAR) 25 MG tablet Take 1 tablet (25 mg total) by mouth daily.   meloxicam (MOBIC) 7.5 MG tablet Take 1 tablet (7.5 mg total) by mouth daily.   Menthol, Topical Analgesic, 4 % GEL Apply topically.    No facility-administered encounter medications on file as of 03/08/2023.    Past Surgical History:  Procedure Laterality Date   BACK SURGERY     TESTICLE SURGERY      Review of Systems  Constitutional:  Negative for chills and fever.  Eyes:  Negative for blurred vision.  Respiratory:  Negative for shortness of breath.   Cardiovascular:  Negative for chest pain.  Gastrointestinal:  Negative for abdominal pain.  Musculoskeletal:  Positive for joint pain and myalgias.  Neurological:  Negative for dizziness and headaches.      Objective    BP 118/74   Pulse 92   Ht 5\' 9"  (1.753 m)   Wt (!) 312 lb (141.5 kg)   SpO2 93%   BMI 46.07 kg/m   Physical Exam Vitals reviewed.  Constitutional:      General: He is not in acute distress.    Appearance: Normal appearance. He is not ill-appearing, toxic-appearing or diaphoretic.  HENT:     Head: Normocephalic.  Eyes:     General:        Right eye: No discharge.        Left eye: No discharge.     Conjunctiva/sclera: Conjunctivae normal.  Cardiovascular:     Rate and Rhythm: Normal rate.     Pulses: Normal pulses.     Heart sounds: Normal heart sounds.  Pulmonary:     Effort: Pulmonary effort is  normal. No respiratory distress.     Breath sounds: Normal breath sounds.  Musculoskeletal:     Right hand: Swelling and tenderness present. Decreased range of motion. Decreased strength. Normal pulse.     Left hand: No swelling or tenderness. Normal range of motion. Normal strength. Normal pulse.     Cervical back: Normal range of motion.  Skin:    General: Skin is warm and dry.     Capillary Refill: Capillary refill takes less than 2 seconds.  Neurological:     General: No focal deficit present.     Mental Status: He is alert and oriented to person, place, and time.     Coordination: Coordination normal.     Gait: Gait normal.  Psychiatric:        Mood and Affect: Mood normal.        Behavior: Behavior normal.        Assessment & Plan:  Finger pain, right -     CYCLIC CITRUL PEPTIDE ANTIBODY, IGG/IGA -     Ambulatory referral to Neurosurgery -     Gabapentin; Take 1 capsule (300 mg total) by mouth 3 (three) times daily as needed.  Dispense: 90 capsule; Refill: 3  Right hand pain Assessment & Plan: Right hand: Patient is unable to exert any force when attempting to squeeze my hand. Symptoms consistent with peripheral neuropathy. Trial Gabapentin 300 PRN  Advise to apply fifth and ring finger hand splint wrist Brace With Glove Support Referral placed to Neurosurgery  Xray showed osteoarthritis  Prednisone and Mobic did help with pt symptoms.     Return if symptoms worsen or fail to improve.   Cruzita Lederer Newman Nip, FNP

## 2023-03-08 NOTE — Assessment & Plan Note (Addendum)
Right hand: Patient is unable to exert any force when attempting to squeeze my hand. Symptoms consistent with peripheral neuropathy. Trial Gabapentin 300 PRN  Advise to apply fifth and ring finger hand splint wrist Brace With Glove Support Referral placed to Neurosurgery  Xray showed osteoarthritis  Prednisone and Mobic did help with pt symptoms.

## 2023-03-19 NOTE — Progress Notes (Deleted)
Referring Physician:  Rica Records, FNP [phone] [address] [place],  [place] [address]  Primary Physician:  Sean Lade, MD  History of Present Illness: 03/19/2023 Sean Mccarty is here today with a chief complaint of ***  Right hand: Patient is unable to exert any force when attempting to squeeze my hand. Symptoms consistent with peripheral neuropathy.  Duration/Date of Injury: *** Mechanism of Injury: *** Specific Nerve if known: {ANATOMY; NERVES:23155} Weakness: ***, {Improving/worsening/no change:60406} Pain ***, {IMPROVED DECREASED NO CHANGE:22066} Numbness ***, {IMPROVED DECREASED NO CHANGE:22066}  Conservative measures:  Physical therapy: {yes/no:20286} Occupational Therapy: {yes/no:20286} Hand Therapy: {yes/no:20286} Injections: {yes/no:20286} Gabapentin: {yes/no:20286}, Lyrica: {yes/no:20286}, Cymbalta: {yes/no:20286}  Past Surgery: {yes/no:20286} Is this a Second Opinion: {yes/no:20286}  Main Question for Surgeon: ***  Review of Systems:  A 10 point review of systems is negative, except for the pertinent positives and negatives detailed in the HPI.  Past Medical History: Past Medical History:  Diagnosis Date   Chest pain 05/11/2012   HTN (hypertension)    Hyperlipidemia    Prediabetes    Snoring 05/11/2012    Past Surgical History: Past Surgical History:  Procedure Laterality Date   BACK SURGERY     TESTICLE SURGERY      Allergies: Allergies as of 03/29/2023   (No Known Allergies)    Medications:  Current Outpatient Medications:    atorvastatin (LIPITOR) 40 MG tablet, Take 1 tablet (40 mg total) by mouth daily., Disp: 90 tablet, Rfl: 1   Cholecalciferol (VITAMIN D3) 25 MCG (1000 UT) capsule, Take 1 capsule (1,000 Units total) by mouth daily., Disp: 90 capsule, Rfl: 1   furosemide (LASIX) 20 MG tablet, Take 1 tablet (20 mg total) by mouth daily as needed for edema., Disp: 30 tablet, Rfl: 3   gabapentin (NEURONTIN) 300 MG  capsule, Take 1 capsule (300 mg total) by mouth 3 (three) times daily as needed., Disp: 90 capsule, Rfl: 3   hydrochlorothiazide (HYDRODIURIL) 25 MG tablet, Take 1 tablet (25 mg total) by mouth daily., Disp: 90 tablet, Rfl: 3   losartan (COZAAR) 25 MG tablet, Take 1 tablet (25 mg total) by mouth daily., Disp: 30 tablet, Rfl: 2   meloxicam (MOBIC) 7.5 MG tablet, Take 1 tablet (7.5 mg total) by mouth daily., Disp: 10 tablet, Rfl: 0   Menthol, Topical Analgesic, 4 % GEL, Apply topically., Disp: , Rfl:   Social History: Social History   Tobacco Use   Smoking status: Never   Smokeless tobacco: Never  Substance Use Topics   Alcohol use: No   Drug use: No    Family Medical History: No family history on file.  Physical Examination: There were no vitals filed for this visit.  General: Patient is in no apparent distress. Attention to examination is appropriate.  Neck:   Supple.  Full range of motion.  Respiratory: Patient is breathing without any difficulty.   NEUROLOGICAL:     Awake, alert, oriented to person, place, and time.  Speech is clear and fluent.   Cranial Nerves: Pupils equal round and reactive to light.  Facial tone is symmetric. Shoulder shrug is symmetric. Tongue protrusion is midline.  There is no pronator drift.  Motor Exam:  ***  Reflexes are ***2+ and symmetric at the biceps, triceps, brachioradialis, patella and achilles.   Hoffman's is absent. Clonus is Absent  Bilateral upper and lower extremity sensation is intact to light touch ***.     Gait is normal.  ***  Medical Decision Making  Imaging: ***  Electrodiagnostics: ***  I have personally reviewed the images and electrodiagnostics and agree with the above interpretation.  Assessment and Plan: Mr. Sean Mccarty is a pleasant 62 y.o. male with ***. The symptoms are causing a significant impact on the patient's life. I have utilized the care everywhere function in epic to review the outside Mccarty  available from external health systems, and have personally reviewed relevant imaging and electrodiagnostic workup.    Thank you for involving me in the care of this patient.    Sean Kim MD/MSCR Neurosurgery - Peripheral Nerve Surgery

## 2023-03-22 ENCOUNTER — Ambulatory Visit: Payer: Medicaid Other | Admitting: Internal Medicine

## 2023-03-22 ENCOUNTER — Encounter: Payer: Self-pay | Admitting: Internal Medicine

## 2023-03-22 VITALS — BP 144/82 | HR 89 | Ht 69.0 in | Wt 319.0 lb

## 2023-03-22 DIAGNOSIS — R600 Localized edema: Secondary | ICD-10-CM | POA: Diagnosis not present

## 2023-03-22 DIAGNOSIS — I10 Essential (primary) hypertension: Secondary | ICD-10-CM

## 2023-03-22 DIAGNOSIS — R0609 Other forms of dyspnea: Secondary | ICD-10-CM

## 2023-03-22 DIAGNOSIS — R21 Rash and other nonspecific skin eruption: Secondary | ICD-10-CM

## 2023-03-22 MED ORDER — TORSEMIDE 20 MG PO TABS
20.0000 mg | ORAL_TABLET | Freq: Every day | ORAL | 1 refills | Status: DC
Start: 2023-03-22 — End: 2023-06-03

## 2023-03-22 NOTE — Progress Notes (Signed)
Acute Office Visit  Subjective:    Patient ID: Sean Mccarty, male    DOB: 06-Jan-1961, 62 y.o.   MRN: [id number]  Chief Complaint  Patient presents with   Leg Swelling    Patient leg and foot is swollen     HPI Patient is in today for complaint of bilateral leg swelling, L > R, which is chronic.  He has had [place] of LE, which was negative for DVT.  He also has been treated for cellulitis in the past, but his swelling did not improve.  He has been given trial of HCTZ and Lasix in the past.  He was taking amlodipine for HTN in the past, which was discontinued considering his leg swelling.  Today, he also reports having chronic skin eruptions on the left LE.  Denies any itching over the site currently.  He also reports dyspnea upon exertion, which is recently worse.  Denies any orthopnea or PND currently.  He currently takes losartan 25 mg QD and HCTZ 25 mg QD for HTN. His BP was elevated today. Denies any chest pain, headache or dizziness currently.    Past Medical History:  Diagnosis Date   Chest pain 05/11/2012   HTN (hypertension)    Hyperlipidemia    Prediabetes    Snoring 05/11/2012    Past Surgical History:  Procedure Laterality Date   BACK SURGERY     TESTICLE SURGERY      History reviewed. No pertinent family history.  Social History   Socioeconomic History   Marital status: Divorced    Spouse name: Not on file   Number of children: Not on file   Years of education: Not on file   Highest education level: Not on file  Occupational History   Not on file  Tobacco Use   Smoking status: Never   Smokeless tobacco: Never  Substance and Sexual Activity   Alcohol use: No   Drug use: No   Sexual activity: Yes  Other Topics Concern   Not on file  Social History Narrative   Not on file   Social Determinants of Health   Financial Resource Strain: Not on file  Food Insecurity: Not on file  Transportation Needs: Not on file  Physical Activity: Not on file   Stress: Not on file  Social Connections: Unknown (01/22/2023)   Received from [facility]   Social Network    Social Network: Not on file  Intimate Partner Violence: Unknown (01/22/2023)   Received from [facility]   HITS    Physically Hurt: Not on file    Insult or Talk Down To: Not on file    Threaten Physical Harm: Not on file    Scream or Curse: Not on file    Outpatient Medications Prior to Visit  Medication Sig Dispense Refill   atorvastatin (LIPITOR) 40 MG tablet Take 1 tablet (40 mg total) by mouth daily. 90 tablet 1   Cholecalciferol (VITAMIN D3) 25 MCG (1000 UT) capsule Take 1 capsule (1,000 Units total) by mouth daily. 90 capsule 1   gabapentin (NEURONTIN) 300 MG capsule Take 1 capsule (300 mg total) by mouth 3 (three) times daily as needed. 90 capsule 3   hydrochlorothiazide (HYDRODIURIL) 25 MG tablet Take 1 tablet (25 mg total) by mouth daily. 90 tablet 3   losartan (COZAAR) 25 MG tablet Take 1 tablet (25 mg total) by mouth daily. 30 tablet 2   meloxicam (MOBIC) 7.5 MG tablet Take 1 tablet (7.5 mg total)  by mouth daily. 10 tablet 0   Menthol, Topical Analgesic, 4 % GEL Apply topically.     furosemide (LASIX) 20 MG tablet Take 1 tablet (20 mg total) by mouth daily as needed for edema. 30 tablet 3   No facility-administered medications prior to visit.    No Known Allergies  Review of Systems  Constitutional:  Negative for chills and fever.  HENT:  Negative for congestion and sore throat.   Eyes:  Negative for pain and discharge.  Respiratory:  Positive for shortness of breath. Negative for cough.   Cardiovascular:  Positive for leg swelling. Negative for chest pain and palpitations.  Gastrointestinal:  Negative for diarrhea, nausea and vomiting.  Endocrine: Negative for polydipsia and polyuria.  Genitourinary:  Negative for dysuria and hematuria.  Musculoskeletal:  Negative for neck pain and neck stiffness.  Skin:  Positive for rash.  Neurological:  Negative  for dizziness, weakness, numbness and headaches.  Psychiatric/Behavioral:  Negative for agitation and behavioral problems.        Objective:    Physical Exam Vitals reviewed.  Constitutional:      General: He is not in acute distress.    Appearance: He is not diaphoretic.  HENT:     Head: Normocephalic and atraumatic.  Eyes:     General: No scleral icterus.    Extraocular Movements: Extraocular movements intact.  Cardiovascular:     Rate and Rhythm: Normal rate and regular rhythm.     Heart sounds: Normal heart sounds. No murmur heard. Pulmonary:     Breath sounds: Normal breath sounds. No wheezing or rales.  Musculoskeletal:     Cervical back: Neck supple. No tenderness.     Right lower leg: Edema (1+) present.     Left lower leg: Edema (2+) present.  Skin:    General: Skin is warm.     Findings: Rash (Blisters and excoriated skin over left LE-image in media) present.  Neurological:     General: No focal deficit present.     Mental Status: He is alert and oriented to person, place, and time.  Psychiatric:        Mood and Affect: Mood normal.        Behavior: Behavior normal.     BP (!) 144/82 (BP Location: Right Arm, Patient Position: Sitting, Cuff Size: Large)   Pulse 89   Ht 5\' 9"  (1.753 m)   Wt (!) 319 lb (144.7 kg)   SpO2 94%   BMI 47.11 kg/m  Wt Readings from Last 3 Encounters:  03/22/23 (!) 319 lb (144.7 kg)  03/08/23 (!) 312 lb (141.5 kg)  02/23/23 (!) 307 lb 1.9 oz (139.3 kg)        Assessment & Plan:   Problem List Items Addressed This Visit       Cardiovascular and Mediastinum   HTN (hypertension)    BP Readings from Last 1 Encounters:  03/22/23 (!) 144/82   Uncontrolled with low losartan 25 mg QD and HCTZ 25 mg QD Added torsemide 20 mg QD for persistent swelling for now Counseled for compliance with the medications Advised DASH diet and moderate exercise/walking, at least 150 mins/week       Relevant Medications   torsemide (DEMADEX)  20 MG tablet     Musculoskeletal and Integument   Localized skin eruption    Image attached in media Does not appear to be classic stasis dermatitis Unclear etiology Referred to dermatology      Relevant Orders   Ambulatory  referral to Dermatology     Other   Edema of left lower extremity - Primary    Chronic LE swelling Likely due to lymphedema Currently takes HCTZ 25 mg QD for HTN Added torsemide 20 mg QD for now for persistent leg swelling Needs to follow strict low-salt diet and perform leg elevation Referred to PT for lymphedema, may benefit from massage therapy and lymphedema pump      Relevant Medications   torsemide (DEMADEX) 20 MG tablet   Other Relevant Orders   Ambulatory referral to Physical Therapy   ECHOCARDIOGRAM COMPLETE   Exertional dyspnea    Has had exertional dyspnea and chronic LE swelling Check echocardiogram May need cardiology evaluation Added torsemide for persistent leg swelling, can also help with exertional dyspnea if it is related to volume overload      Relevant Orders   ECHOCARDIOGRAM COMPLETE     Meds ordered this encounter  Medications   torsemide (DEMADEX) 20 MG tablet    Sig: Take 1 tablet (20 mg total) by mouth daily.    Dispense:  30 tablet    Refill:  1     Alvetta Hidrogo Concha Se, MD

## 2023-03-22 NOTE — Patient Instructions (Addendum)
Please start taking Torsemide as prescribed.  Please cut down sodium intake to maximum of 2500 mg (2.5 gram) in a day.  Please continue taking other medications as prescribed.  Perform leg elevation and wear compression socks.

## 2023-03-26 ENCOUNTER — Telehealth: Payer: Self-pay | Admitting: Internal Medicine

## 2023-03-26 NOTE — Assessment & Plan Note (Signed)
BP Readings from Last 1 Encounters:  03/22/23 (!) 144/82   Uncontrolled with low losartan 25 mg QD and HCTZ 25 mg QD Added torsemide 20 mg QD for persistent swelling for now Counseled for compliance with the medications Advised DASH diet and moderate exercise/walking, at least 150 mins/week

## 2023-03-26 NOTE — Assessment & Plan Note (Signed)
Image attached in media Does not appear to be classic stasis dermatitis Unclear etiology Referred to dermatology

## 2023-03-26 NOTE — Assessment & Plan Note (Signed)
Has had exertional dyspnea and chronic LE swelling Check echocardiogram May need cardiology evaluation Added torsemide for persistent leg swelling, can also help with exertional dyspnea if it is related to volume overload

## 2023-03-26 NOTE — Telephone Encounter (Signed)
Dorothy called from [facility] needs prior authorization, she request a call back at [phone] trying to get patient schedule as soon as possible. [phone].

## 2023-03-26 NOTE — Assessment & Plan Note (Signed)
Chronic LE swelling Likely due to lymphedema Currently takes HCTZ 25 mg QD for HTN Added torsemide 20 mg QD for now for persistent leg swelling Needs to follow strict low-salt diet and perform leg elevation Referred to PT for lymphedema, may benefit from massage therapy and lymphedema pump

## 2023-03-29 ENCOUNTER — Ambulatory Visit: Payer: Medicaid Other | Admitting: Neurosurgery

## 2023-03-30 NOTE — Progress Notes (Unsigned)
Referring Physician:  Rica Records, FNP [phone] [address]. [address] [place],  [place] [address]  Primary Physician:  Billie Lade, MD  History of Present Illness: 03/30/2023 Sean Mccarty is here today with a chief complaint of right hand pain.  He states that is mostly in his right finger.  He feels like he has been dealing with this for at least 2 to 3 months.  He notices severe burning in his fifth digit that restricts him from motion and activity.  Often wakes him up at night.  He has tried different compression gloves and has utilized running water heat and ice all with variable results.  He does feel like he has decreased strength in that finger but it is often pain related.  He often noted's triggering of that finger.  Some concern whether or not this is a peripheral neuropathy so he is referred for evaluation.  He states that he does not get radiation into his fourth digit or up the medial aspect of his hand or arm.  He does note point tenderness at the MCP joint that if he touches it can cause severe burning and pain and sometimes radiating into the digit itself.  He has not had any surgery.  He does have a recent infection consistent with left lower extremity cellulitis.  He denies any inflammatory diseases such as psoriasis or rheumatoid arthritis.  Review of Systems:  A 10 point review of systems is negative, except for the pertinent positives and negatives detailed in the HPI.  Past Medical History: Past Medical History:  Diagnosis Date   Chest pain 05/11/2012   HTN (hypertension)    Hyperlipidemia    Prediabetes    Snoring 05/11/2012    Past Surgical History: Past Surgical History:  Procedure Laterality Date   BACK SURGERY     TESTICLE SURGERY      Allergies: Allergies as of 03/31/2023   (No Known Allergies)    Medications:  Current Outpatient Medications:    atorvastatin (LIPITOR) 40 MG tablet, Take 1 tablet (40 mg total) by mouth daily., Disp: 90  tablet, Rfl: 1   Cholecalciferol (VITAMIN D3) 25 MCG (1000 UT) capsule, Take 1 capsule (1,000 Units total) by mouth daily., Disp: 90 capsule, Rfl: 1   gabapentin (NEURONTIN) 300 MG capsule, Take 1 capsule (300 mg total) by mouth 3 (three) times daily as needed., Disp: 90 capsule, Rfl: 3   hydrochlorothiazide (HYDRODIURIL) 25 MG tablet, Take 1 tablet (25 mg total) by mouth daily., Disp: 90 tablet, Rfl: 3   losartan (COZAAR) 25 MG tablet, Take 1 tablet (25 mg total) by mouth daily., Disp: 30 tablet, Rfl: 2   meloxicam (MOBIC) 7.5 MG tablet, Take 1 tablet (7.5 mg total) by mouth daily., Disp: 10 tablet, Rfl: 0   Menthol, Topical Analgesic, 4 % GEL, Apply topically., Disp: , Rfl:    torsemide (DEMADEX) 20 MG tablet, Take 1 tablet (20 mg total) by mouth daily., Disp: 30 tablet, Rfl: 1  Social History: Social History   Tobacco Use   Smoking status: Never   Smokeless tobacco: Never  Substance Use Topics   Alcohol use: No   Drug use: No    Family Medical History: No family history on file.  Physical Examination: There were no vitals filed for this visit.  General: Patient is in no apparent distress. Attention to examination is appropriate.  Neck:   Supple.  Full range of motion.  Respiratory: Patient is breathing without  any difficulty.   NEUROLOGICAL:     Awake, alert, oriented to person, place, and time.  Speech is clear and fluent.   Cranial Nerves: Pupils equal round and reactive to light.  Facial tone is symmetric. Shoulder shrug is symmetric. Tongue protrusion is midline.  There is no pronator drift.  Motor Exam:  Good strength in his ulnar distribution in testable fingers including the interossei and ulnar thumb musculature.  He does have restriction in movement both passive and active in his right fifth digit.  He has some swelling noted at the metacarpal phalangeal joint that is point tender.  Reflexes are 2+ and symmetric at the biceps, triceps, brachioradialis, patella  and achilles.   Hoffman's is absent.   Upper extremity sensation is intact with some decrease sensation in his right fifth digit  Gait is normal.     Medical Decision Making  Imaging: Narrative & Impression  CLINICAL DATA:  Right fifth digit pain   EXAM: RIGHT HAND - COMPLETE 3+ VIEW   COMPARISON:  05/13/2007   FINDINGS: Frontal, oblique, and lateral views of the right hand are obtained. No acute fracture, subluxation, or dislocation. Prior healed fifth metacarpal fracture. Mild interphalangeal joint space narrowing as well as joint space narrowing at the first carpometacarpal joint, consistent with osteoarthritis. Soft tissues are unremarkable.   IMPRESSION: 1. Mild osteoarthritis. 2. No acute bony abnormality.     Electronically Signed   By: Sharlet Salina M.D.   On: 03/03/2023 16:04    Electrodiagnostics: No electrodiagnostic testing to review.  I have personally reviewed the images and electrodiagnostics and agree with the above interpretation.  Assessment and Plan: [name] is a pleasant 62 y.o. male with right fifth digit pain.  He has been struggling with this for little over 2 months.  States that it is at the base of his pinky.  When it is really bad it can radiate into the digit distally.  He does not get proximal radiation.  Has not noticed any proximal sensory loss.  He does feel weakness in that finger but otherwise his grip feels strong.  On physical examination he has point tenderness at the MCP joint and this reproduces some of his distal pain if palpated deep enough.  On physical exam he has good ulnar strength otherwise including the interosseous musculature and thumb musculature.  He does not get any spreading into his fourth digit.  I am somewhat concerned that this is more likely a musculoskeletal issue and would like to have him see one of our hand surgery specialist.  I will plan on making that referral today.  She did not be musculoskeletal in origin  we could move forward with electrodiagnostic study such as an EMG/nerve conduction study, however we can hold off on that until his evaluation with hand surgery. The symptoms are causing a significant impact on the patient's life. I have utilized the care everywhere function in epic to review the outside records available from external health systems, and have personally reviewed relevant imaging and electrodiagnostic workup.    Thank you for involving me in the care of this patient.    Lovenia Kim MD/MSCR Neurosurgery - Peripheral Nerve Surgery

## 2023-03-31 ENCOUNTER — Ambulatory Visit: Payer: Medicaid Other | Admitting: Neurosurgery

## 2023-03-31 ENCOUNTER — Encounter: Payer: Self-pay | Admitting: Neurosurgery

## 2023-03-31 VITALS — BP 136/92 | Ht 69.0 in | Wt 298.0 lb

## 2023-03-31 DIAGNOSIS — M79644 Pain in right finger(s): Secondary | ICD-10-CM | POA: Diagnosis not present

## 2023-03-31 DIAGNOSIS — M79641 Pain in right hand: Secondary | ICD-10-CM

## 2023-04-15 ENCOUNTER — Other Ambulatory Visit: Payer: Self-pay

## 2023-04-15 ENCOUNTER — Ambulatory Visit (HOSPITAL_COMMUNITY): Payer: Medicaid Other | Attending: Internal Medicine | Admitting: Physical Therapy

## 2023-04-15 DIAGNOSIS — R600 Localized edema: Secondary | ICD-10-CM | POA: Diagnosis not present

## 2023-04-15 DIAGNOSIS — I890 Lymphedema, not elsewhere classified: Secondary | ICD-10-CM | POA: Diagnosis present

## 2023-04-15 NOTE — Therapy (Signed)
OUTPATIENT PHYSICAL THERAPY LYMPHEDEMA EVALUATION  Patient Name: Sean Mccarty MRN: [id number] DOB:11-07-1960, 62 y.o., male Today's Date: 04/15/2023  END OF SESSION:  PT End of Session - 04/15/23 1439     Visit Number 1    Number of Visits 9    Date for PT Re-Evaluation 05/15/23    Authorization Type medicaid healthy auth put in    Progress Note Due on Visit 9    PT Start Time 1355    PT Stop Time 1430    PT Time Calculation (min) 35 min    Activity Tolerance Patient tolerated treatment well    Behavior During Therapy [facility] for tasks assessed/performed             Past Medical History:  Diagnosis Date   Chest pain 05/11/2012   HTN (hypertension)    Hyperlipidemia    Prediabetes    Snoring 05/11/2012   Past Surgical History:  Procedure Laterality Date   BACK SURGERY     TESTICLE SURGERY     Patient Active Problem List   Diagnosis Date Noted   Exertional dyspnea 03/22/2023   Localized skin eruption 03/22/2023   Right hand pain 03/08/2023   Pain of finger of right hand 02/24/2023   Edema of left lower extremity 11/09/2022   Colon cancer screening 11/09/2022   Encounter for general adult medical examination with abnormal findings 11/09/2022   Morbid obesity (HCC) 11/09/2022   HTN (hypertension) 11/09/2022   HLD (hyperlipidemia) 11/09/2022   Chest pain 05/11/2012   Snoring 05/11/2012    PCP: Christel Mormon  REFERRING PROVIDER: Anabel Halon, MD  REFERRING DIAG: R60.0 (ICD-10-CM) - Edema of left lower extremity  THERAPY DIAG:  Lymphedema  Rationale for Evaluation and Treatment: Rehabilitation  ONSET DATE: 08/24/22  SUBJECTIVE:                                                                                                                                                                                           SUBJECTIVE STATEMENT:  Pt states that he had cellulitis in February.  Since then his leg does not seem like it will go down and stay down.  As soon  as he has been up for an hour he has increased swelling.  At night he will have some pain with it.    PERTINENT HISTORY: see above   PAIN:  Not in his legs in his hand, pt is seeing a different provider for this  Are you having pain? Yes NPRS scale: 4/10 Pain location: calf  Pain orientation: Left and Lower  PAIN TYPE: aching Pain description: aching  Aggravating factors: being  up  Relieving factors: not sure has been wearing compression garment without .   PRECAUTIONS: Other: cellulitis  WEIGHT BEARING RESTRICTIONS: No  FALLS:  Has patient fallen in last 6 months? No  LIVING ENVIRONMENT: Lives with: lives with their family Lives in: House/apartment   OCCUPATION: not employed   PRIOR LEVEL OF FUNCTION: Independent  PATIENT GOALS: less swelling    OBJECTIVE:  COGNITION: Overall cognitive status: Within functional limits for tasks assessed   PALPATION: Noted induration posterior aspect of LT calf, noted discoloration   OBSERVATIONS / OTHER ASSESSMENTS:    LE LANDMARK RIGHT eval  At groin   30 cm proximal to suprapatella   20 cm proximal to suprapatella   10 cm proximal to suprapatella   At midpatella / popliteal crease   30 cm proximal to floor at lateral plantar foot 41.9  20 cm proximal to floor at lateral plantar foot 31.6  10 cm proximal to floor at lateral plantar foot 27.4  Circumference of ankle/heel 36  5 cm proximal to 1st MTP joint 26.8  Across MTP joint 26.8  Around proximal great toe   (Blank rows = not tested)  LE LANDMARK LEFT eval  At groin   30 cm proximal to suprapatella   20 cm proximal to suprapatella   10 cm proximal to suprapatella   At midpatella / popliteal crease   30 cm proximal to floor at lateral plantar foot  49.2  20 cm proximal to floor at lateral plantar foot 37.6  10 cm proximal to floor at lateral plantar foot 32.4  Circumference of ankle/heel 40.5  5 cm proximal to 1st MTP joint 27.2  Across MTP joint 26.2   Around proximal great toe   (Blank rows = not tested)   TODAY'S TREATMENT:                                                                                                                              DATE: 04/15/23  Evaluation:  Explained what lymphedema is and how it is controlled:  4 aspects of treatment  to include: Skin care cleans and moisturize  Exercise Manual Compression therapy   PATIENT EDUCATION:  Education details: HEp Person educated: Patient Education method: Programmer, multimedia, Demonstration, Verbal cues, and Handouts Education comprehension: returned demonstration  HOME EXERCISE PROGRAM: Ankle pumps, LAQ, hip adduction, marching and diaphragm breathing.   ASSESSMENT:  CLINICAL IMPRESSION: Patient is a 62 y.o. m who was seen today for physical therapy evaluation and treatment for Lt lymphedema. He states that his leg is near normal side in the morning but gets bigger as the day goes on.  He has tried elevation and compression socks for over three months without improvement.  Evaluation demonstrates increased heat, increased redness, induration and edema indicative of lymphedema.  Mr. Salber will benefit from skilled PT to address these issues and reduce his leg to normal size to reduce the risk of cellulitis.    OBJECTIVE  IMPAIRMENTS: decreased ROM, increased edema, pain, and decreased skin integrity.   Marland Kitchen    REHAB POTENTIAL: Good  CLINICAL DECISION MAKING: Evolving/moderate complexity  EVALUATION COMPLEXITY: Moderate  GOALS: Goals reviewed with patient? No  SHORT TERM GOALS: Target date: 9/16  PT to be I in HEP in order to increased lymphatic circulation Baseline: Goal status: INITIAL  2.  PT to be completing daily skin care to decrease risk of cellulitis Baseline:  Goal status: INITIAL  3.  PT to to have lost 2 cm from Rt LE to reduce risk of cellulitis  Baseline:  Goal status: INITIAL   LONG TERM GOALS: Target date: 10/15  PT to be I in self manual  techniques Baseline:  Goal status: INITIAL  2.  Pt to have lost 5 cm from LE to reduce risk of cellulitis  Baseline:  Goal status: INITIAL  3.  PT to have received and be using a compression pump  Baseline:  Goal status: INITIAL  4.  PT to have obtained and be using a compression garment.  Baseline:  Goal status: INITIAL  PLAN:  PT FREQUENCY: 3x/week  PT DURATION: 4 weeks  PLANNED INTERVENTIONS: Therapeutic exercises, Patient/Family education, Self Care, Manual lymph drainage, Compression bandaging, and Manual therapy  PLAN FOR NEXT SESSION: Total decongestive techniques, educate in self manual   Virgina Organ, PT CLT [phone]  04/15/2023, 2:44 PM

## 2023-04-19 ENCOUNTER — Ambulatory Visit (HOSPITAL_COMMUNITY): Payer: Medicaid Other | Admitting: Physical Therapy

## 2023-04-19 DIAGNOSIS — I890 Lymphedema, not elsewhere classified: Secondary | ICD-10-CM

## 2023-04-19 NOTE — Therapy (Signed)
OUTPATIENT PHYSICAL THERAPY LYMPHEDEMA EVALUATION TREATMENT Patient Name: Sean Mccarty MRN: [id number] DOB:1960-09-30, 62 y.o., male Today's Date: 04/19/2023  END OF SESSION:  PT End of Session - 04/19/23 1257     Visit Number 2    Number of Visits 9    Date for PT Re-Evaluation 05/15/23    Authorization Type medicaid healthy    Authorization Time Period approved 1 re-eval and 12 visits 8/26-10/06    Authorization - Visit Number 1    Authorization - Number of Visits 12    Progress Note Due on Visit 9    PT Start Time 1300    PT Stop Time 1340    PT Time Calculation (min) 40 min    Activity Tolerance Patient tolerated treatment well    Behavior During Therapy [facility] for tasks assessed/performed             Past Medical History:  Diagnosis Date   Chest pain 05/11/2012   HTN (hypertension)    Hyperlipidemia    Prediabetes    Snoring 05/11/2012   Past Surgical History:  Procedure Laterality Date   BACK SURGERY     TESTICLE SURGERY     Patient Active Problem List   Diagnosis Date Noted   Exertional dyspnea 03/22/2023   Localized skin eruption 03/22/2023   Right hand pain 03/08/2023   Pain of finger of right hand 02/24/2023   Edema of left lower extremity 11/09/2022   Colon cancer screening 11/09/2022   Encounter for general adult medical examination with abnormal findings 11/09/2022   Morbid obesity (HCC) 11/09/2022   HTN (hypertension) 11/09/2022   HLD (hyperlipidemia) 11/09/2022   Chest pain 05/11/2012   Snoring 05/11/2012    PCP: Christel Mormon  REFERRING PROVIDER: Anabel Halon, MD  REFERRING DIAG: R60.0 (ICD-10-CM) - Edema of left lower extremity  THERAPY DIAG:  Lymphedema  Rationale for Evaluation and Treatment: Rehabilitation  ONSET DATE: 08/24/22  SUBJECTIVE:                                                                                                                                                                                            SUBJECTIVE STATEMENT:  Pt states it felt good.  Got a little itchy yesterday.  Profore is still intact.  Evaluation:  pt states that he had cellulitis in February.  Since then his leg does not seem like it will go down and stay down.  As soon as he has been up for an hour he has increased swelling.  At night he will have some pain with it.    PERTINENT HISTORY: see above  PAIN:  Not in his legs in his hand, pt is seeing a different provider for this  Are you having pain? Yes NPRS scale: 4/10 Pain location: calf  Pain orientation: Left and Lower  PAIN TYPE: aching Pain description: aching  Aggravating factors: being up  Relieving factors: not sure has been wearing compression garment without .   PRECAUTIONS: Other: cellulitis  WEIGHT BEARING RESTRICTIONS: No  FALLS:  Has patient fallen in last 6 months? No  LIVING ENVIRONMENT: Lives with: lives with their family Lives in: House/apartment   OCCUPATION: not employed   PRIOR LEVEL OF FUNCTION: Independent  PATIENT GOALS: less swelling    OBJECTIVE:  COGNITION: Overall cognitive status: Within functional limits for tasks assessed   PALPATION: Noted induration posterior aspect of LT calf, noted discoloration   OBSERVATIONS / OTHER ASSESSMENTS:    LE LANDMARK RIGHT eval  At groin   30 cm proximal to suprapatella   20 cm proximal to suprapatella   10 cm proximal to suprapatella   At midpatella / popliteal crease   30 cm proximal to floor at lateral plantar foot 41.9  20 cm proximal to floor at lateral plantar foot 31.6  10 cm proximal to floor at lateral plantar foot 27.4  Circumference of ankle/heel 36  5 cm proximal to 1st MTP joint 26.8  Across MTP joint 26.8  Around proximal great toe   (Blank rows = not tested)  LE LANDMARK LEFT eval  At groin   30 cm proximal to suprapatella   20 cm proximal to suprapatella   10 cm proximal to suprapatella   At midpatella / popliteal crease   30 cm proximal to  floor at lateral plantar foot  49.2  20 cm proximal to floor at lateral plantar foot 37.6  10 cm proximal to floor at lateral plantar foot 32.4  Circumference of ankle/heel 40.5  5 cm proximal to 1st MTP joint 27.2  Across MTP joint 26.2  Around proximal great toe   (Blank rows = not tested)   TODAY'S TREATMENT:                                                                                                                              DATE:  04/19/23 Manual lymph drainage for Lt LE  with inguinal-axillary anastomosis.Deep and superficial activation of surrounding nodes including abdominal and lumbar.  LE moisturized well with both lotion and vaseline  Profore system used for compression on Lt distal LE only.   04/15/23  Evaluation:  Explained what lymphedema is and how it is controlled:  4 aspects of treatment  to include: Skin care cleans and moisturize  Exercise Manual Compression therapy   PATIENT EDUCATION:  Education details: HEp Person educated: Patient Education method: Programmer, multimedia, Demonstration, Verbal cues, and Handouts Education comprehension: returned demonstration  HOME EXERCISE PROGRAM: Ankle pumps, LAQ, hip adduction, marching and diaphragm breathing.   ASSESSMENT:  CLINICAL IMPRESSION: Dressing removed revealing much improved  Lt LE including overall edema and erythema reduction.  LE cleansed well and moisturized before beginning manual lymph drainage.  Most induration present in posterior LE at calf region.  LE moisturized again following manual and prior to reapplication of profore compression system.  Pt reported overall comfort with compression.  Expects his new knee high compression to be received today that he has ordered.  Instructed to bring them to his next visit as he may be ready to transition to this.    Evaluation: Patient is a 62 y.o. m who was seen today for physical therapy evaluation and treatment for Lt lymphedema. He states that his leg is near  normal side in the morning but gets bigger as the day goes on.  He has tried elevation and compression socks for over three months without improvement.  Evaluation demonstrates increased heat, increased redness, induration and edema indicative of lymphedema.  Mr. Tirabassi will benefit from skilled PT to address these issues and reduce his leg to normal size to reduce the risk of cellulitis.    OBJECTIVE IMPAIRMENTS: decreased ROM, increased edema, pain, and decreased skin integrity.   Marland Kitchen    REHAB POTENTIAL: Good  CLINICAL DECISION MAKING: Evolving/moderate complexity  EVALUATION COMPLEXITY: Moderate  GOALS: Goals reviewed with patient? No  SHORT TERM GOALS: Target date: 9/16  PT to be I in HEP in order to increased lymphatic circulation Baseline: Goal status: IN PROGRESS   2.  PT to be completing daily skin care to decrease risk of cellulitis Baseline:  Goal status: IN PROGRESS  3.  PT to to have lost 2 cm from Rt LE to reduce risk of cellulitis  Baseline:  Goal status: IN PROGRESS   LONG TERM GOALS: Target date: 10/15  PT to be I in self manual techniques Baseline:  Goal status: IN PROGRESS  2.  Pt to have lost 5 cm from LE to reduce risk of cellulitis  Baseline:  Goal status: IN PROGRESS  3.  PT to have received and be using a compression pump  Baseline:  Goal status: IIN PROGRESS  4.  PT to have obtained and be using a compression garment.  Baseline:  Goal status: IN PROGRESS  PLAN:  PT FREQUENCY: 3x/week  PT DURATION: 4 weeks  PLANNED INTERVENTIONS: Therapeutic exercises, Patient/Family education, Self Care, Manual lymph drainage, Compression bandaging, and Manual therapy  PLAN FOR NEXT SESSION: Total decongestive techniques, educate in self manual.  Measured weekly with transition to compression stockings when ready.  Lurena Nida, PTA/CLT [facility] Ph: [phone]  04/19/2023, 3:38 PM

## 2023-04-21 ENCOUNTER — Ambulatory Visit (HOSPITAL_COMMUNITY): Payer: Medicaid Other | Admitting: Physical Therapy

## 2023-04-21 DIAGNOSIS — I890 Lymphedema, not elsewhere classified: Secondary | ICD-10-CM | POA: Diagnosis not present

## 2023-04-21 NOTE — Therapy (Signed)
OUTPATIENT PHYSICAL THERAPY LYMPHEDEMA DISCHARGE Patient Name: Sean Mccarty MRN: [id number] DOB:June 07, 1961, 62 y.o., male Today's Date: 04/21/2023 PHYSICAL THERAPY DISCHARGE SUMMARY  Visits from Start of Care: 3  Current functional level related to goals / functional outcomes: Pt goals have been met   Remaining deficits: none   Education / Equipment: HEP   Patient agrees to discharge. Patient goals were met. Patient is being discharged due to meeting the stated rehab goals.  END OF SESSION:  PT End of Session - 04/21/23 1623     Visit Number 3    Number of Visits 3   Date for PT Re-Evaluation 05/15/23    Authorization Type medicaid healthy    Authorization Time Period approved 1 re-eval and 12 visits 8/26-10/06    Authorization - Visit Number 3   Authorization - Number of Visits 12    Progress Note Due on Visit 9    PT Start Time 1557    PT Stop Time 1618    PT Time Calculation (min) 21 min    Activity Tolerance Patient tolerated treatment well    Behavior During Therapy [facility] for tasks assessed/performed             Past Medical History:  Diagnosis Date   Chest pain 05/11/2012   HTN (hypertension)    Hyperlipidemia    Prediabetes    Snoring 05/11/2012   Past Surgical History:  Procedure Laterality Date   BACK SURGERY     TESTICLE SURGERY     Patient Active Problem List   Diagnosis Date Noted   Exertional dyspnea 03/22/2023   Localized skin eruption 03/22/2023   Right hand pain 03/08/2023   Pain of finger of right hand 02/24/2023   Edema of left lower extremity 11/09/2022   Colon cancer screening 11/09/2022   Encounter for general adult medical examination with abnormal findings 11/09/2022   Morbid obesity (HCC) 11/09/2022   HTN (hypertension) 11/09/2022   HLD (hyperlipidemia) 11/09/2022   Chest pain 05/11/2012   Snoring 05/11/2012    PCP: Christel Mormon  REFERRING PROVIDER: Anabel Halon, MD  REFERRING DIAG: R60.0 (ICD-10-CM) - Edema of left  lower extremity  THERAPY DIAG:  Lymphedema  Rationale for Evaluation and Treatment: Rehabilitation  ONSET DATE: 08/24/22  SUBJECTIVE:                                                                                                                                                                                           SUBJECTIVE STATEMENT:  Pt states it feels so much better.   Profore is still intact.  Evaluation:  pt states that he had cellulitis  in February.  Since then his leg does not seem like it will go down and stay down.  As soon as he has been up for an hour he has increased swelling.  At night he will have some pain with it.   *pt admits to having cellulitis flair up of Rt LE also in September of 2023  PERTINENT HISTORY: see above   PAIN:  Not in his legs in his hand, pt is seeing a different provider for this no pain in his leg at this time.  Are you having pain? Yes NPRS scale: 4/10 Pain location: calf  .   PRECAUTIONS: Other: cellulitis  WEIGHT BEARING RESTRICTIONS: No  FALLS:  Has patient fallen in last 6 months? No  LIVING ENVIRONMENT: Lives with: lives with their family Lives in: House/apartment   OCCUPATION: not employed   PRIOR LEVEL OF FUNCTION: Independent  PATIENT GOALS: less swelling    OBJECTIVE:  COGNITION: Overall cognitive status: Within functional limits for tasks assessed   PALPATION: Noted induration posterior aspect of LT calf, noted discoloration   OBSERVATIONS / OTHER ASSESSMENTS:    LE LANDMARK RIGHT eval  At groin   30 cm proximal to suprapatella   20 cm proximal to suprapatella   10 cm proximal to suprapatella   At midpatella / popliteal crease   30 cm proximal to floor at lateral plantar foot 41.9  20 cm proximal to floor at lateral plantar foot 31.6  10 cm proximal to floor at lateral plantar foot 27.4  Circumference of ankle/heel 36  5 cm proximal to 1st MTP joint 26.8  Across MTP joint 26.8  Around proximal great  toe   (Blank rows = not tested)  LE LANDMARK LEFT eval Left 04/21/23  At groin    30 cm proximal to suprapatella    20 cm proximal to suprapatella    10 cm proximal to suprapatella    At midpatella / popliteal crease    30 cm proximal to floor at lateral plantar foot  49.2  42.5  20 cm proximal to floor at lateral plantar foot 37.6 35  10 cm proximal to floor at lateral plantar foot 32.4 27  Circumference of ankle/heel 40.5 35  5 cm proximal to 1st MTP joint 27.2 27  Across MTP joint 26.2 25  Around proximal great toe    (Blank rows = not tested)   TODAY'S TREATMENT:                                                                                                                              DATE:  04/21/23 Measurement of Lt LE (see above) Education on compression garments, care and wear Donning/doffing garments using sock donner  04/19/23 Manual lymph drainage for Lt LE  with inguinal-axillary anastomosis.Deep and superficial activation of surrounding nodes including abdominal and lumbar.  LE moisturized well with both lotion and vaseline  Profore system used for compression on Lt distal  LE only.   04/15/23  Evaluation:  Explained what lymphedema is and how it is controlled:  4 aspects of treatment  to include: Skin care cleans and moisturize  Exercise Manual Compression therapy   PATIENT EDUCATION:  Education details: HEp Person educated: Patient Education method: Programmer, multimedia, Demonstration, Verbal cues, and Handouts Education comprehension: returned demonstration  HOME EXERCISE PROGRAM: Ankle pumps, LAQ, hip adduction, marching and diaphragm breathing.   ASSESSMENT:  CLINICAL IMPRESSION: Lt LE re-measured with drastic reduction noted and comparable in size now to Rt LE.  PT brought his stockings today and is ready to transition to these.  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  Used sock donner  with demonstration first then had patient use device to don stocking.  Pt is going to order one of these as well as additional stockings.  Stocking with good fit and reported overall comfort.  Educated pt on need to purchase new ones every 6 months, how to clean and care for stockings.  Pt aware he needs to wear these everyday and also highly urged to wear on Rt LE as well since he has history of cellulitis.  Pt verbalized understanding.    Evaluation: Patient is a 62 y.o. m who was seen today for physical therapy evaluation and treatment for Lt lymphedema. He states that his leg is near normal side in the morning but gets bigger as the day goes on.  He has tried elevation and compression socks for over three months without improvement.  Evaluation demonstrates increased heat, increased redness, induration and edema indicative of lymphedema.  Mr. Ishman will benefit from skilled PT to address these issues and reduce his leg to normal size to reduce the risk of cellulitis.    OBJECTIVE IMPAIRMENTS: decreased ROM, increased edema, pain, and decreased skin integrity.   Marland Kitchen    REHAB POTENTIAL: Good  CLINICAL DECISION MAKING: Evolving/moderate complexity  EVALUATION COMPLEXITY: Moderate  GOALS: Goals reviewed with patient? No  SHORT TERM GOALS: Target date: 9/16  PT to be I in HEP in order to increased lymphatic circulation Baseline: Goal status:MET   2.  PT to be completing daily skin care to decrease risk of cellulitis Baseline:  Goal status: MET  3.  PT to to have lost 2 cm from Rt LE to reduce risk of cellulitis  Baseline:  Goal status: MET   LONG TERM GOALS: Target date: 10/15  PT to be I in self manual techniques Baseline:  Goal status: PARTLY MET (difficult with Rt hand injury)  2.  Pt to have lost 5 cm from LE to reduce risk of cellulitis  Baseline:  Goal status: MET  3.  PT to have received and be using a compression pump  Baseline:  Goal status: NOT MET (has not received  yet)  4.  PT to have obtained and be using a compression garment.  Baseline:  Goal status: MET  PLAN:  PT FREQUENCY: 3x/week  PT DURATION: 4 weeks  PLANNED INTERVENTIONS: Therapeutic exercises, Patient/Family education, Self Care, Manual lymph drainage, Compression bandaging, and Manual therapy  PLAN FOR NEXT SESSION: Discharge to self care.  Lurena Nida, PTA/CLT [facility] Ph: [phone] Virgina Organ, PT CLT [phone]  04/21/2023, 4:41 PM

## 2023-04-23 ENCOUNTER — Encounter (HOSPITAL_COMMUNITY): Payer: Medicaid Other

## 2023-04-26 ENCOUNTER — Encounter (HOSPITAL_COMMUNITY): Payer: Medicaid Other | Admitting: Physical Therapy

## 2023-04-27 ENCOUNTER — Telehealth (HOSPITAL_COMMUNITY): Payer: Self-pay | Admitting: Physical Therapy

## 2023-04-27 NOTE — Telephone Encounter (Signed)
PT called stated that despite wearing the compression sock every day and completing the exercises he is noticing that the back of his calf is really hard again.  Therapist recommended ordering 30-40 mmhg and that the therapist will attempt to get a compression pump ordered for him.  Virgina Organ, PT CLT [phone]

## 2023-04-28 ENCOUNTER — Encounter (HOSPITAL_COMMUNITY): Payer: Medicaid Other

## 2023-04-28 ENCOUNTER — Ambulatory Visit: Payer: Medicaid Other | Admitting: Internal Medicine

## 2023-04-29 ENCOUNTER — Telehealth: Payer: Self-pay | Admitting: Internal Medicine

## 2023-04-29 NOTE — Telephone Encounter (Signed)
Needs a new referral with PHYSICAL THERAPY  to continue leg. Leg is now more swelling. Only did 3 sessions before needs new referral  Call patient [phone]

## 2023-04-30 ENCOUNTER — Encounter (HOSPITAL_COMMUNITY): Payer: Medicaid Other | Admitting: Physical Therapy

## 2023-04-30 ENCOUNTER — Other Ambulatory Visit: Payer: Self-pay

## 2023-04-30 DIAGNOSIS — R600 Localized edema: Secondary | ICD-10-CM

## 2023-04-30 NOTE — Telephone Encounter (Signed)
Referral placed.

## 2023-05-03 ENCOUNTER — Encounter (HOSPITAL_COMMUNITY): Payer: Medicaid Other | Admitting: Physical Therapy

## 2023-05-03 ENCOUNTER — Ambulatory Visit (INDEPENDENT_AMBULATORY_CARE_PROVIDER_SITE_OTHER): Payer: Medicaid Other | Admitting: Orthopedic Surgery

## 2023-05-03 DIAGNOSIS — G5601 Carpal tunnel syndrome, right upper limb: Secondary | ICD-10-CM

## 2023-05-03 DIAGNOSIS — M65351 Trigger finger, right little finger: Secondary | ICD-10-CM | POA: Diagnosis not present

## 2023-05-03 MED ORDER — BETAMETHASONE SOD PHOS & ACET 6 (3-3) MG/ML IJ SUSP
6.0000 mg | INTRAMUSCULAR | Status: AC | PRN
Start: 2023-05-03 — End: 2023-05-03
  Administered 2023-05-03: 6 mg via INTRA_ARTICULAR

## 2023-05-03 MED ORDER — LIDOCAINE HCL 1 % IJ SOLN
1.0000 mL | INTRAMUSCULAR | Status: AC | PRN
Start: 2023-05-03 — End: 2023-05-03
  Administered 2023-05-03: 1 mL

## 2023-05-03 NOTE — Progress Notes (Signed)
Sean Mccarty - 62 y.o. male MRN [id number]  Date of birth: [birth year]  Office Visit Note: Visit Date: 05/03/2023 PCP: Billie Lade, MD Referred by: Lovenia Kim, MD  Subjective: No chief complaint on file.  HPI: Sean Mccarty is a pleasant 62 y.o. male who presents today for evaluation of ongoing numbness and tingling in the right hand with associated clicking and locking of the small finger.  States that the symptoms been present for roughly 2 to 3 months, worsening in nature.  He does have nocturnal symptoms as well.  No other prior treatments or workup.  Pertinent ROS were reviewed with the patient and found to be negative unless otherwise specified above in HPI.   Visit Reason: right hand/small finger Duration of symptoms: 2-3 months Hand dominance: right Occupation: Disabled Diabetic: No Smoking: No Heart/Lung History: none Blood Thinners:  none  Prior Testing/EMG: 03/03/23 xrays Injections (Date): none Treatments: brace Prior Surgery: none   Assessment & Plan: Visit Diagnoses: No diagnosis found.  Plan: Extensive discussion was had the patient today regarding his right hand complaints.  Does appear to be exhibiting clinical signs and symptoms consistent with carpal tunnel syndrome, would like to further investigate with electrodiagnostic of the right upper extremity.  Will have him fitted for a right wrist brace today to be utilized particularly for nocturnal symptoms.    As for the right small finger, this is consistent with stenosing tenosynovitis, right small finger trigger digit.  Given that he has not undergone prior treatment, he is an appropriate candidate for right small finger cortisone injection to the A1 pulley for symptom relief.    Will have him obtain the electrodiagnostic study after today's visit and will follow-up for discussion of results and to discuss further treatment.  Will also follow-up the small finger trigger digit at that time to  determine if the cortisone injection gave him appropriate relief.  Follow-up: No follow-ups on file.   Meds & Orders: No orders of the defined types were placed in this encounter.  No orders of the defined types were placed in this encounter.    Procedures: Hand/UE Inj: R small A1 for trigger finger on 05/03/2023 1:13 PM Indications: pain Details: 25 G needle, volar approach Medications: 1 mL lidocaine 1 %; 6 mg betamethasone acetate-betamethasone sodium phosphate 6 (3-3) MG/ML Consent was given by the patient. Patient was prepped and draped in the usual sterile fashion.          Clinical History: No specialty comments available.  He reports that he has never smoked. He has never used smokeless tobacco.  Recent Labs    11/10/22 0812 02/23/23 1558  HGBA1C 6.1*  --   LABURIC  --  8.0    Objective:   Vital Signs: There were no vitals taken for this visit.  Physical Exam  Gen: Well-appearing, in no acute distress; non-toxic CV: Regular Rate. Well-perfused. Warm.  Resp: Breathing unlabored on room air; no wheezing. Psych: Fluid speech in conversation; appropriate affect; normal thought process  Ortho Exam Right hand: - Positive Tinel's carpal tunnel, positive Phalen's, positive Durkan's compression - Sensation is diminished in the median nerve distribution right hand, 2-point discrimination 8-9 mm - 5/5 APB without evidence of thenar atrophy - Pain over the small finger A1 pulley, palpable nodule, notable clicking with deep flexion  Imaging: No results found.  Past Medical/Family/Surgical/Social History: Medications & Allergies reviewed per EMR, new medications updated. Patient Active Problem List   Diagnosis  Date Noted   Exertional dyspnea 03/22/2023   Localized skin eruption 03/22/2023   Right hand pain 03/08/2023   Pain of finger of right hand 02/24/2023   Edema of left lower extremity 11/09/2022   Colon cancer screening 11/09/2022   Encounter for general adult  medical examination with abnormal findings 11/09/2022   Morbid obesity (HCC) 11/09/2022   HTN (hypertension) 11/09/2022   HLD (hyperlipidemia) 11/09/2022   Chest pain 05/11/2012   Snoring 05/11/2012   Past Medical History:  Diagnosis Date   Chest pain 05/11/2012   HTN (hypertension)    Hyperlipidemia    Prediabetes    Snoring 05/11/2012   No family history on file. Past Surgical History:  Procedure Laterality Date   BACK SURGERY     TESTICLE SURGERY     Social History   Occupational History   Not on file  Tobacco Use   Smoking status: Never   Smokeless tobacco: Never  Substance and Sexual Activity   Alcohol use: No   Drug use: No   Sexual activity: Yes    [name] Trevor Mace, M.D. [facility] 1:10 PM

## 2023-05-05 ENCOUNTER — Encounter (HOSPITAL_COMMUNITY): Payer: Medicaid Other | Admitting: Physical Therapy

## 2023-05-06 ENCOUNTER — Ambulatory Visit (HOSPITAL_COMMUNITY)
Admission: RE | Admit: 2023-05-06 | Discharge: 2023-05-06 | Disposition: A | Discharge status: Home / Self Care | Payer: Medicaid Other | Source: Ambulatory Visit | Attending: Internal Medicine | Admitting: Internal Medicine

## 2023-05-06 DIAGNOSIS — R0609 Other forms of dyspnea: Secondary | ICD-10-CM | POA: Diagnosis present

## 2023-05-06 DIAGNOSIS — R600 Localized edema: Secondary | ICD-10-CM | POA: Insufficient documentation

## 2023-05-06 LAB — ECHOCARDIOGRAM COMPLETE
Area-P 1/2: 4.49 cm2
S' Lateral: 2.9 cm

## 2023-05-06 NOTE — Progress Notes (Signed)
*  PRELIMINARY RESULTS* Echocardiogram 2D Echocardiogram has been performed.  Stacey Drain 05/06/2023, 3:13 PM

## 2023-05-07 ENCOUNTER — Encounter (HOSPITAL_COMMUNITY): Payer: Medicaid Other

## 2023-05-10 ENCOUNTER — Encounter (HOSPITAL_COMMUNITY): Payer: Medicaid Other | Admitting: Physical Therapy

## 2023-05-11 ENCOUNTER — Ambulatory Visit (INDEPENDENT_AMBULATORY_CARE_PROVIDER_SITE_OTHER): Payer: Medicaid Other | Admitting: Physical Medicine and Rehabilitation

## 2023-05-11 DIAGNOSIS — R202 Paresthesia of skin: Secondary | ICD-10-CM | POA: Diagnosis not present

## 2023-05-11 NOTE — Progress Notes (Unsigned)
Functional Pain Scale - descriptive words and definitions  Moderate (4)   Constantly aware of pain, can complete ADLs with modification/sleep marginally affected at times/passive distraction is of no use, but active distraction gives some relief. Moderate range order  Average Pain 7  Right handed. Right hand pain and numbness. Middle finger on right hand "locks up"

## 2023-05-12 ENCOUNTER — Encounter (HOSPITAL_COMMUNITY): Payer: Medicaid Other | Admitting: Physical Therapy

## 2023-05-12 ENCOUNTER — Encounter: Payer: Self-pay | Admitting: Physical Medicine and Rehabilitation

## 2023-05-12 NOTE — Progress Notes (Unsigned)
Sean Mccarty - 62 y.o. male MRN [id number]  Date of birth: 05/27/1961  Office Visit Note: Visit Date: 05/11/2023 PCP: Billie Lade, MD Referred by: Samuella Cota, MD  Subjective: Chief Complaint  Patient presents with   Right Hand - Numbness, Pain   HPI: Sean Mccarty is a 62 y.o. male who comes in todayHPI    ROS Otherwise per HPI.  Assessment & Plan: Visit Diagnoses:    ICD-10-CM   1. 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   There is no significant electrodiagnostic evidence of any other focal nerve entrapment, brachial plexopathy or cervical radiculopathy.   Recommendations: 1.  Follow-up with referring physician. 2.  Continue current management of symptoms. 3.  Suggest surgical evaluation.  Meds & Orders: No orders of the defined types were placed in this encounter.   Orders Placed This Encounter  Procedures   NCV with EMG (electromyography)    Follow-up: No follow-ups on file.   Procedures: No procedures performed  EMG & NCV Findings: Evaluation of the right median motor nerve showed prolonged distal onset latency (5.5 ms), reduced amplitude (4.6 mV), and decreased conduction velocity (Elbow-Wrist, 46 m/s).  The right ulnar motor and the right ulnar sensory nerves showed reduced amplitude (R2.7, R9.5 V).  The right median (across palm) sensory nerve showed prolonged distal peak latency (Wrist, 5.0 ms) and prolonged distal peak latency (Palm, 2.8 ms).  All remaining nerves (as indicated in the following tables) were within normal limits.    Needle evaluation of the right abductor pollicis brevis muscle showed increased insertional activity.  All remaining muscles (as indicated in the following table) showed no evidence of  electrical instability.    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   There is no significant electrodiagnostic evidence of any other focal nerve entrapment, brachial plexopathy or cervical radiculopathy.   Recommendations: 1.  Follow-up with referring physician. 2.  Continue current management of symptoms. 3.  Suggest surgical evaluation.  ___________________________ Naaman Plummer FAAPMR Board Certified, American Board of Physical Medicine and Rehabilitation    Nerve Conduction Studies Anti Sensory Summary Table   Stim Site NR Peak (ms) Norm Peak (ms) P-T Amp (V) Norm P-T Amp Site1 Site2 Delta-P (ms) Dist (cm) Vel (m/s) Norm Vel (m/s)  Right Median Acr Palm Anti Sensory (2nd Digit)  31.7C  Wrist    *5.0 <3.6 12.8 >10 Wrist Palm 2.2 0.0    Palm    *2.8 <2.0 2.5         Right Radial Anti Sensory (Base 1st Digit)  31.9C  Wrist    2.1 <3.1 10.6  Wrist Base 1st Digit 2.1 0.0    Right Ulnar Anti Sensory (5th Digit)  32C  Wrist    3.3 <3.7 *9.5 >15.0 Wrist 5th Digit 3.3 14.0 42 >38   Motor Summary Table   Stim Site NR Onset (ms) Norm Onset (ms) O-P Amp (mV) Norm O-P Amp Site1 Site2 Delta-0 (ms) Dist (cm) Vel (m/s) Norm Vel (m/s)  Right Median Motor (Abd Poll Brev)  32C  Wrist    *5.5 <4.2 *4.6 >5 Elbow Wrist 4.6 21.0 *46 >50  Elbow  10.1  4.5         Right Ulnar Motor (Abd Dig Min)  32.1C  Wrist    2.7 <4.2 *2.7 >3 B Elbow Wrist 3.6 20.0 56 >53  B Elbow    6.3  7.0  A Elbow B Elbow 1.7 11.0 65 >53  A Elbow    8.0  7.5          EMG   Side Muscle Nerve Root Ins Act Fibs Psw Amp Dur Poly Recrt Int Dennie Bible Comment  Right Abd Poll Brev Median C8-T1 *CRD Nml Nml Nml Nml 0 Nml Nml   Right 1stDorInt Ulnar C8-T1 Nml Nml Nml Nml Nml 0 Nml Nml   Right PronatorTeres Median C6-7 Nml Nml Nml Nml Nml 0 Nml Nml   Right Biceps  Musculocut C5-6 Nml Nml Nml Nml Nml 0 Nml Nml     Nerve Conduction Studies Anti Sensory Left/Right Comparison   Stim Site L Lat (ms) R Lat (ms) L-R Lat (ms) L Amp (V) R Amp (V) L-R Amp (%) Site1 Site2 L Vel (m/s) R Vel (m/s) L-R Vel (m/s)  Median Acr Palm Anti Sensory (2nd Digit)  31.7C  Wrist  *5.0   12.8  Wrist Palm     Palm  *2.8   2.5        Radial Anti Sensory (Base 1st Digit)  31.9C  Wrist  2.1   10.6  Wrist Base 1st Digit     Ulnar Anti Sensory (5th Digit)  32C  Wrist  3.3   *9.5  Wrist 5th Digit  42    Motor Left/Right Comparison   Stim Site L Lat (ms) R Lat (ms) L-R Lat (ms) L Amp (mV) R Amp (mV) L-R Amp (%) Site1 Site2 L Vel (m/s) R Vel (m/s) L-R Vel (m/s)  Median Motor (Abd Poll Brev)  32C  Wrist  *5.5   *4.6  Elbow Wrist  *46   Elbow  10.1   4.5        Ulnar Motor (Abd Dig Min)  32.1C  Wrist  2.7   *2.7  B Elbow Wrist  56   B Elbow  6.3   7.0  A Elbow B Elbow  65   A Elbow  8.0   7.5           Waveforms:             Clinical History: No specialty comments available.   He reports that he has never smoked. He has never used smokeless tobacco.  Recent Labs    11/10/22 0812 02/23/23 1558  HGBA1C 6.1*  --   LABURIC  --  8.0    Objective:  VS:  HT:    WT:   BMI:     BP:   HR: bpm  TEMP: ( )  RESP:  Physical Exam  Ortho Exam  Imaging: No results found.  Past Medical/Family/Surgical/Social History: Medications & Allergies reviewed per EMR, new medications updated. Patient Active Problem List   Diagnosis Date Noted   Exertional dyspnea 03/22/2023   Localized skin eruption 03/22/2023   Right hand pain 03/08/2023   Pain of finger of right hand 02/24/2023   Edema of left lower extremity 11/09/2022   Colon cancer screening 11/09/2022   Encounter for general adult medical examination with abnormal findings 11/09/2022   Morbid obesity (HCC) 11/09/2022   HTN (hypertension) 11/09/2022   HLD (hyperlipidemia) 11/09/2022   Chest pain 05/11/2012    Snoring 05/11/2012  Past Medical History:  Diagnosis Date   Chest pain 05/11/2012   HTN (hypertension)    Hyperlipidemia    Prediabetes    Snoring 05/11/2012   No family history on file. Past Surgical History:  Procedure Laterality Date   BACK SURGERY     TESTICLE SURGERY     Social History   Occupational History   Not on file  Tobacco Use   Smoking status: Never   Smokeless tobacco: Never  Substance and Sexual Activity   Alcohol use: No   Drug use: No   Sexual activity: Yes

## 2023-05-12 NOTE — Procedures (Unsigned)
EMG & NCV Findings: Evaluation of the right median motor nerve showed prolonged distal onset latency (5.5 ms), reduced amplitude (4.6 mV), and decreased conduction velocity (Elbow-Wrist, 46 m/s).  The right ulnar motor and the right ulnar sensory nerves showed reduced amplitude (R2.7, R9.5 V).  The right median (across palm) sensory nerve showed prolonged distal peak latency (Wrist, 5.0 ms) and prolonged distal peak latency (Palm, 2.8 ms).  All remaining nerves (as indicated in the following tables) were within normal limits.    Needle evaluation of the right abductor pollicis brevis muscle showed increased insertional activity.  All remaining muscles (as indicated in the following table) showed no evidence of electrical instability.    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   There is no significant electrodiagnostic evidence of any other focal nerve entrapment, brachial plexopathy or cervical radiculopathy.   Recommendations: 1.  Follow-up with referring physician. 2.  Continue current management of symptoms. 3.  Suggest surgical evaluation.  ___________________________ Naaman Plummer FAAPMR Board Certified, American Board of Physical Medicine and Rehabilitation    Nerve Conduction Studies Anti Sensory Summary Table   Stim Site NR Peak (ms) Norm Peak (ms) P-T Amp (V) Norm P-T Amp Site1 Site2 Delta-P (ms) Dist (cm) Vel (m/s) Norm Vel (m/s)  Right Median Acr Palm Anti Sensory (2nd Digit)  31.7C  Wrist    *5.0 <3.6 12.8 >10 Wrist Palm 2.2 0.0    Palm    *2.8 <2.0 2.5         Right Radial Anti Sensory (Base 1st Digit)  31.9C  Wrist    2.1 <3.1 10.6  Wrist Base 1st Digit 2.1 0.0    Right Ulnar Anti Sensory (5th Digit)  32C  Wrist    3.3 <3.7 *9.5 >15.0 Wrist 5th Digit 3.3 14.0 42 >38   Motor Summary Table   Stim Site NR Onset  (ms) Norm Onset (ms) O-P Amp (mV) Norm O-P Amp Site1 Site2 Delta-0 (ms) Dist (cm) Vel (m/s) Norm Vel (m/s)  Right Median Motor (Abd Poll Brev)  32C  Wrist    *5.5 <4.2 *4.6 >5 Elbow Wrist 4.6 21.0 *46 >50  Elbow    10.1  4.5         Right Ulnar Motor (Abd Dig Min)  32.1C  Wrist    2.7 <4.2 *2.7 >3 B Elbow Wrist 3.6 20.0 56 >53  B Elbow    6.3  7.0  A Elbow B Elbow 1.7 11.0 65 >53  A Elbow    8.0  7.5          EMG   Side Muscle Nerve Root Ins Act Fibs Psw Amp Dur Poly Recrt Int Dennie Bible Comment  Right Abd Poll Brev Median C8-T1 *CRD Nml Nml Nml Nml 0 Nml Nml   Right 1stDorInt Ulnar C8-T1 Nml Nml Nml Nml Nml 0 Nml Nml   Right PronatorTeres Median C6-7 Nml Nml Nml Nml Nml 0 Nml Nml   Right Biceps Musculocut C5-6 Nml Nml Nml Nml Nml 0 Nml Nml     Nerve Conduction Studies Anti Sensory Left/Right Comparison   Stim Site L Lat (ms) R Lat (ms) L-R Lat (ms) L Amp (V) R Amp (V) L-R Amp (%) Site1 Site2 L Vel (m/s) R Vel (m/s) L-R Vel (m/s)  Median Acr Palm Anti Sensory (2nd Digit)  31.7C  Wrist  *5.0   12.8  Wrist Palm     Palm  *2.8   2.5        Radial Anti Sensory (Base 1st Digit)  31.9C  Wrist  2.1   10.6  Wrist Base 1st Digit     Ulnar Anti Sensory (5th Digit)  32C  Wrist  3.3   *9.5  Wrist 5th Digit  42    Motor Left/Right Comparison   Stim Site L Lat (ms) R Lat (ms) L-R Lat (ms) L Amp (mV) R Amp (mV) L-R Amp (%) Site1 Site2 L Vel (m/s) R Vel (m/s) L-R Vel (m/s)  Median Motor (Abd Poll Brev)  32C  Wrist  *5.5   *4.6  Elbow Wrist  *46   Elbow  10.1   4.5        Ulnar Motor (Abd Dig Min)  32.1C  Wrist  2.7   *2.7  B Elbow Wrist  56   B Elbow  6.3   7.0  A Elbow B Elbow  65   A Elbow  8.0   7.5           Waveforms:

## 2023-05-14 ENCOUNTER — Encounter (HOSPITAL_COMMUNITY): Payer: Medicaid Other

## 2023-05-17 ENCOUNTER — Encounter (HOSPITAL_COMMUNITY): Payer: Medicaid Other | Admitting: Physical Therapy

## 2023-05-19 ENCOUNTER — Other Ambulatory Visit: Payer: Self-pay

## 2023-05-19 ENCOUNTER — Encounter (HOSPITAL_COMMUNITY): Payer: Self-pay | Admitting: Physical Therapy

## 2023-05-19 ENCOUNTER — Encounter (HOSPITAL_COMMUNITY): Payer: Medicaid Other | Admitting: Physical Therapy

## 2023-05-19 ENCOUNTER — Ambulatory Visit (HOSPITAL_COMMUNITY): Payer: Medicaid Other | Attending: Internal Medicine | Admitting: Physical Therapy

## 2023-05-19 DIAGNOSIS — I890 Lymphedema, not elsewhere classified: Secondary | ICD-10-CM | POA: Diagnosis present

## 2023-05-19 DIAGNOSIS — R600 Localized edema: Secondary | ICD-10-CM | POA: Diagnosis not present

## 2023-05-19 NOTE — Therapy (Signed)
OUTPATIENT PHYSICAL THERAPY LYMPHEDEMA EVALUATION  Patient Name: Sean Mccarty MRN: [id number] DOB:27-Feb-1961, 62 y.o., male Today's Date: 05/19/2023  END OF SESSION:  PT End of Session - 05/19/23 1558     Visit Number 1    Number of Visits 9    Date for PT Re-Evaluation 06/09/23    Authorization Type medicaid healthy    Authorization Time Period requeseted visits    Progress Note Due on Visit 9    PT Start Time 1600    PT Stop Time 1640    PT Time Calculation (min) 40 min    Activity Tolerance Patient tolerated treatment well    Behavior During Therapy Sean Mccarty for tasks assessed/performed             Past Medical History:  Diagnosis Date   Chest pain 05/11/2012   HTN (hypertension)    Hyperlipidemia    Prediabetes    Snoring 05/11/2012   Past Surgical History:  Procedure Laterality Date   BACK SURGERY     TESTICLE SURGERY     Patient Active Problem List   Diagnosis Date Noted   Exertional dyspnea 03/22/2023   Localized skin eruption 03/22/2023   Right hand pain 03/08/2023   Pain of finger of right hand 02/24/2023   Edema of left lower extremity 11/09/2022   Colon cancer screening 11/09/2022   Encounter for general adult medical examination with abnormal findings 11/09/2022   Morbid obesity (HCC) 11/09/2022   HTN (hypertension) 11/09/2022   HLD (hyperlipidemia) 11/09/2022   Chest pain 05/11/2012   Snoring 05/11/2012    PCP: Sean Mccarty   REFERRING PROVIDER: Delmar Mccarty    REFERRING DIAG: edema  THERAPY DIAG:  Lymphedema, not elsewhere classified  Rationale for Evaluation and Treatment: Rehabilitation  ONSET DATE: February  SUBJECTIVE:                                                                                                                                                                                           SUBJECTIVE STATEMENT:  Pt states that he had cellulitis in February.  He had therapy and the  swelling went down with  compression bandaging.  He was advised to exercise and wear 20-30 mm hg compression garments which he did.  He states that he could barely get the garment on, the garment was painful and he could not get the garment off so he cut it off.  The pt states that he has received his pump and is using it once a day.    He currently has 15-20 mm hg compression on.  He is still having swelling but  he has improved since he started using the pump two weeks ago.   PERTINENT HISTORY: as above  PAIN:  Are you having pain? No  PRECAUTIONS: Other: celllulitis    WEIGHT BEARING RESTRICTIONS: No  FALLS:  Has patient fallen in last 6 months? No    PRIOR LEVEL OF FUNCTION: Independent  PATIENT GOALS: less swelling    OBJECTIVE: Note: Objective measures were completed at Evaluation unless otherwise noted.  COGNITION: Overall cognitive status: Within functional limits for tasks assessed   PALPATION: Noted induration  LYMPHEDEMA ASSESSMENTS:   LE LANDMARK RIGHT 04/15/23 Eval 05/19/23  At groin     30 cm proximal to suprapatella     20 cm proximal to suprapatella     10 cm proximal to suprapatella     At midpatella / popliteal crease     30 cm proximal to floor at lateral plantar foot 41.9   20 cm proximal to floor at lateral plantar foot 31.6   10 cm proximal to floor at lateral plantar foot 27.4   Circumference of ankle/heel 36   5 cm proximal to 1st MTP joint 26.8   Across MTP joint 26.8   Around proximal great toe     (Blank rows = not tested)   LE LANDMARK LEFT 04/15/23 Eval 05/19/23  At groin     30 cm proximal to suprapatella     20 cm proximal to suprapatella     10 cm proximal to suprapatella     At midpatella / popliteal crease     30 cm proximal to floor at lateral plantar foot   49.2 46  20 cm proximal to floor at lateral plantar foot 37.6 34.7  10 cm proximal to floor at lateral plantar foot 32.4 30.5  Circumference of ankle/heel 40.5 37  5 cm proximal to 1st MTP joint  27.2 26  Across MTP joint 26.2 25  Around proximal great toe       TODAY'S TREATMENT:                                                                                                                              DATE: 05/19/23 Measured LE, encouraged pt to use the pump more than once a day.  Encouraged patient to return with the 20-19mmhg compression garment so we can use the butler and trouble shoot taking the garment on and off, review of exercises.    PATIENT EDUCATION:  Education details: see above Person educated: Patient Education method: Explanation and Verbal cues Education comprehension: verbalized understanding  HOME EXERCISE PROGRAM: Pt has at home.   ASSESSMENT:  CLINICAL IMPRESSION: Patient is a 62 y.o. m who was seen today for physical therapy evaluation and treatment for Lt lymphedema.   He has been to therapy before and his leg volume decreased but after he was discharged the edema returned.  The pt states he can not tolerate the 20-75mmhg.  Therapist explained that for  lymphedema this is the pressure that he really needs to have.  PT will bring his garment in to ensure proper fitting next treatment.Therapist feels pt will benefit from pumping at multiple times of the day..  Evaluation demonstrates increased induration indicative of lymphedema.  Sean Mccarty will benefit from skilled PT to address these issues and reduce his leg to normal size to reduce the risk of cellulitis.     OBJECTIVE IMPAIRMENTS: increased edema.   ACTIVITY LIMITATIONS: locomotion level  REHAB POTENTIAL: Good  CLINICAL DECISION MAKING: Evolving/moderate complexity  EVALUATION COMPLEXITY: Low  GOALS: Goals reviewed with patient? No  SHORT TERM GOALS: Target date: 10/18  PT measurement to decrease by 2 cm at the 30 and 20 mark Baseline: Goal status: INITIAL  2.  PT to be able done and doff 20-30 mmhg compression.  Baseline:  Goal status: INITIAL   LONG TERM GOALS: Target date:  06/09/23  PT Lt LE measurements  to be within 1-2 cm of his RT to decrease risk of cellulitis and increase ease of donning and doffing compression garment.  Baseline:  Goal status: INITIAL  PLAN:  PT FREQUENCY: 3x/week  PT DURATION: 3 weeks  PLANNED INTERVENTIONS: Patient/Family education, Self Care, Manual lymph drainage, and Compression bandaging if needed.   PLAN FOR NEXT SESSION: Begin manual decongestive technique for Lt LE, don 20-30mmhg compression and have pt doff and re-don.    Virgina Organ, PT CLT [phone]  05/19/2023, 4:43 PM

## 2023-05-20 ENCOUNTER — Ambulatory Visit (HOSPITAL_COMMUNITY): Payer: Medicaid Other | Admitting: Physical Therapy

## 2023-05-21 ENCOUNTER — Encounter (HOSPITAL_COMMUNITY): Payer: Medicaid Other | Admitting: Physical Therapy

## 2023-05-24 ENCOUNTER — Encounter (HOSPITAL_COMMUNITY): Payer: Medicaid Other | Admitting: Physical Therapy

## 2023-05-26 ENCOUNTER — Encounter (HOSPITAL_COMMUNITY): Payer: Medicaid Other | Admitting: Physical Therapy

## 2023-05-26 ENCOUNTER — Ambulatory Visit (HOSPITAL_COMMUNITY): Payer: Medicaid Other | Admitting: Physical Therapy

## 2023-05-26 DIAGNOSIS — I890 Lymphedema, not elsewhere classified: Secondary | ICD-10-CM

## 2023-05-26 NOTE — Therapy (Signed)
OUTPATIENT PHYSICAL THERAPY LYMPHEDEMA Treatment   Patient Name: Sean Mccarty MRN: [id number] DOB:1961-08-01, 62 y.o., male Today's Date: 05/26/2023  END OF SESSION:  PT End of Session - 05/26/23 1345     Visit Number 2    Number of Visits 9    Date for PT Re-Evaluation 06/09/23    Authorization Type medicaid healthy    Authorization Time Period 4 visits approved 10/9 thru 11/7    Authorization - Visit Number 1    Authorization - Number of Visits 4    Progress Note Due on Visit 5    PT Start Time 1303    PT Stop Time 1345    PT Time Calculation (min) 42 min    Activity Tolerance Patient tolerated treatment well    Behavior During Therapy [facility] for tasks assessed/performed              Past Medical History:  Diagnosis Date   Chest pain 05/11/2012   HTN (hypertension)    Hyperlipidemia    Prediabetes    Snoring 05/11/2012   Past Surgical History:  Procedure Laterality Date   BACK SURGERY     TESTICLE SURGERY     Patient Active Problem List   Diagnosis Date Noted   Exertional dyspnea 03/22/2023   Localized skin eruption 03/22/2023   Right hand pain 03/08/2023   Pain of finger of right hand 02/24/2023   Edema of left lower extremity 11/09/2022   Colon cancer screening 11/09/2022   Encounter for general adult medical examination with abnormal findings 11/09/2022   Morbid obesity (HCC) 11/09/2022   HTN (hypertension) 11/09/2022   HLD (hyperlipidemia) 11/09/2022   Chest pain 05/11/2012   Snoring 05/11/2012    PCP: Delmar Landau   REFERRING PROVIDER: Delmar Landau    REFERRING DIAG: edema  THERAPY DIAG:  Lymphedema, not elsewhere classified  Rationale for Evaluation and Treatment: Rehabilitation  ONSET DATE: February  SUBJECTIVE:                                                                                                                                                                                           SUBJECTIVE STATEMENT:Pt states that  he has been pumping and completing his exercises.     Pt states that he had cellulitis in February.  He had therapy and the  swelling went down with compression bandaging.  He was advised to exercise and wear 20-30 mm hg compression garments which he did.  He states that he could barely get the garment on, the garment was painful and he could not get the garment off so he cut it  off.  The pt started to swell without the compression garment and he called this department and spoke to this therapist.  On the date of the call the therapist ordered a compression pump for the pt which the  pt states that  received.  He is pumping once a day and his leg has gone down, although it continues to be swollen.    He currently is wearing 15-20 mm hg compression. PERTINENT HISTORY: as above  PAIN:  Are you having pain? No  PRECAUTIONS: Other: celllulitis    WEIGHT BEARING RESTRICTIONS: No  FALLS:  Has patient fallen in last 6 months? No    PRIOR LEVEL OF FUNCTION: Independent  PATIENT GOALS: less swelling    OBJECTIVE: Note: Objective measures were completed at Evaluation unless otherwise noted.  COGNITION: Overall cognitive status: Within functional limits for tasks assessed   PALPATION: Noted induration  LYMPHEDEMA ASSESSMENTS:   LE LANDMARK RIGHT 04/15/23 Eval 05/19/23  At groin     30 cm proximal to suprapatella     20 cm proximal to suprapatella     10 cm proximal to suprapatella     At midpatella / popliteal crease     30 cm proximal to floor at lateral plantar foot 41.9   20 cm proximal to floor at lateral plantar foot 31.6   10 cm proximal to floor at lateral plantar foot 27.4   Circumference of ankle/heel 36   5 cm proximal to 1st MTP joint 26.8   Across MTP joint 26.8   Around proximal great toe     (Blank rows = not tested)   LE LANDMARK LEFT 04/15/23 Eval 05/19/23  At groin     30 cm proximal to suprapatella     20 cm proximal to suprapatella     10 cm proximal to  suprapatella     At midpatella / popliteal crease     30 cm proximal to floor at lateral plantar foot   49.2 46  20 cm proximal to floor at lateral plantar foot 37.6 34.7  10 cm proximal to floor at lateral plantar foot 32.4 30.5  Circumference of ankle/heel 40.5 37  5 cm proximal to 1st MTP joint 27.2 26  Across MTP joint 26.2 25  Around proximal great toe       TODAY'S TREATMENT:                                                                                                                              DATE:  05/26/23 Manual to include: cervical, axillary, superficial and deep abdominal, Lt inguinal to axillary anastomosis and Lt LE with posterior completed in prone.  Instruction of donning compression garment with butler.  Education to complete diaphragm breathing x 5 minutes a day and to order compression t-shirts.  05/19/23 Measured LE, encouraged pt to use the pump more than once a day.  Encouraged patient to return with the 20-10mmhg compression garment  so we can use the butler and trouble shoot taking the garment on and off, review of exercises.    PATIENT EDUCATION:  Education details: 10/16: use of a butler how to doff compression garment and using compression t-shirt Person educated: Patient Education method: Explanation and Verbal cues Education comprehension: verbalized understanding  HOME EXERCISE PROGRAM: Pt has at home.   ASSESSMENT:  CLINICAL IMPRESSION: Treatment involved decongestive manual techniques and education on donning and doffing compression garment.  Pt educated that he most likely has some edema in his abdominal area by purchasing a compression t-shirt.   Patient is a 62 y.o. m who was seen today for physical therapy evaluation and treatment for Lt lymphedema.   He has been to therapy before and his leg volume decreased but after he was discharged the edema returned.  The pt states he can not tolerate the 20-95mmhg.  Therapist explained that for lymphedema  this is the pressure that he really needs to have.  PT will bring his garment in to ensure proper fitting next treatment.Therapist feels pt will benefit from pumping at multiple times of the day..  Evaluation demonstrates increased induration indicative of lymphedema.  Sean Mccarty will benefit from skilled PT to address these issues and reduce his leg to normal size to reduce the risk of cellulitis.     OBJECTIVE IMPAIRMENTS: increased edema.   ACTIVITY LIMITATIONS: locomotion level  REHAB POTENTIAL: Good  CLINICAL DECISION MAKING: Evolving/moderate complexity  EVALUATION COMPLEXITY: Low  GOALS: Goals reviewed with patient? No  SHORT TERM GOALS: Target date: 10/18  PT measurement to decrease by 2 cm at the 30 and 20 mark Baseline: Goal status: on-going   2.  PT to be able done and doff 20-30 mmhg compression.  Baseline:  Goal status: on-going    LONG TERM GOALS: Target date: 06/09/23  PT Lt LE measurements  to be within 1-2 cm of his RT to decrease risk of cellulitis and increase ease of donning and doffing compression garment.  Baseline:  Goal status: on-going   PLAN:  PT FREQUENCY: 3x/week  PT DURATION: 3 weeks  PLANNED INTERVENTIONS: Patient/Family education, Self Care, Manual lymph drainage, and Compression bandaging if needed.   PLAN FOR NEXT SESSION: Begin manual decongestive technique for Lt LE, don 20-30mmhg compression and have pt doff and re-don.    Virgina Organ, PT CLT [phone]  05/26/2023, 1:52 PM

## 2023-05-28 ENCOUNTER — Encounter (HOSPITAL_COMMUNITY): Payer: Medicaid Other

## 2023-05-31 ENCOUNTER — Encounter (HOSPITAL_COMMUNITY): Payer: Medicaid Other

## 2023-05-31 ENCOUNTER — Encounter (HOSPITAL_COMMUNITY): Payer: Medicaid Other | Admitting: Physical Therapy

## 2023-06-02 ENCOUNTER — Encounter (HOSPITAL_COMMUNITY): Payer: Medicaid Other | Admitting: Physical Therapy

## 2023-06-02 ENCOUNTER — Ambulatory Visit (HOSPITAL_COMMUNITY): Payer: Medicaid Other

## 2023-06-02 ENCOUNTER — Encounter (HOSPITAL_COMMUNITY): Payer: Self-pay

## 2023-06-02 DIAGNOSIS — I890 Lymphedema, not elsewhere classified: Secondary | ICD-10-CM

## 2023-06-02 NOTE — Therapy (Signed)
OUTPATIENT PHYSICAL THERAPY LYMPHEDEMA Treatment   Patient Name: Sean Mccarty MRN: [id number] DOB:09-28-1960, 62 y.o., male Today's Date: 06/02/2023  END OF SESSION:  PT End of Session - 06/02/23 1455     Visit Number 3    Number of Visits 9    Date for PT Re-Evaluation 06/09/23    Authorization Type medicaid healthy    Authorization Time Period 4 visits approved 10/9 thru 11/7    Authorization - Visit Number 2    Authorization - Number of Visits 4    Progress Note Due on Visit 5    PT Start Time 1423    PT Stop Time 1455    PT Time Calculation (min) 32 min    Activity Tolerance Patient tolerated treatment well    Behavior During Therapy [facility] for tasks assessed/performed               Past Medical History:  Diagnosis Date   Chest pain 05/11/2012   HTN (hypertension)    Hyperlipidemia    Prediabetes    Snoring 05/11/2012   Past Surgical History:  Procedure Laterality Date   BACK SURGERY     TESTICLE SURGERY     Patient Active Problem List   Diagnosis Date Noted   Exertional dyspnea 03/22/2023   Localized skin eruption 03/22/2023   Right hand pain 03/08/2023   Pain of finger of right hand 02/24/2023   Edema of left lower extremity 11/09/2022   Colon cancer screening 11/09/2022   Encounter for general adult medical examination with abnormal findings 11/09/2022   Morbid obesity (HCC) 11/09/2022   HTN (hypertension) 11/09/2022   HLD (hyperlipidemia) 11/09/2022   Chest pain 05/11/2012   Snoring 05/11/2012    PCP: Delmar Landau   REFERRING PROVIDER: Delmar Landau    REFERRING DIAG: edema  THERAPY DIAG:  Lymphedema, not elsewhere classified  Rationale for Evaluation and Treatment: Rehabilitation  ONSET DATE: February  SUBJECTIVE:                                                                                                                                                                                           SUBJECTIVE STATEMENT: Pt arrived  wearing compression garment, has been pumping and completing his exercises regularly.    Pt states that he had cellulitis in February.  He had therapy and the  swelling went down with compression bandaging.  He was advised to exercise and wear 20-30 mm hg compression garments which he did.  He states that he could barely get the garment on, the garment was painful and he could not get the garment off so  he cut it off.  The pt started to swell without the compression garment and he called this department and spoke to this therapist.  On the date of the call the therapist ordered a compression pump for the pt which the  pt states that  received.  He is pumping once a day and his leg has gone down, although it continues to be swollen.    He currently is wearing 15-20 mm hg compression. PERTINENT HISTORY: as above  PAIN:  Are you having pain? No  PRECAUTIONS: Other: celllulitis    WEIGHT BEARING RESTRICTIONS: No  FALLS:  Has patient fallen in last 6 months? No    PRIOR LEVEL OF FUNCTION: Independent  PATIENT GOALS: less swelling    OBJECTIVE: Note: Objective measures were completed at Evaluation unless otherwise noted.  COGNITION: Overall cognitive status: Within functional limits for tasks assessed   PALPATION: Noted induration  LYMPHEDEMA ASSESSMENTS:   LE LANDMARK RIGHT 04/15/23 Eval 05/19/23  At groin     30 cm proximal to suprapatella     20 cm proximal to suprapatella     10 cm proximal to suprapatella     At midpatella / popliteal crease     30 cm proximal to floor at lateral plantar foot 41.9   20 cm proximal to floor at lateral plantar foot 31.6   10 cm proximal to floor at lateral plantar foot 27.4   Circumference of ankle/heel 36   5 cm proximal to 1st MTP joint 26.8   Across MTP joint 26.8   Around proximal great toe     (Blank rows = not tested)   LE LANDMARK LEFT 04/15/23 Eval 05/19/23 Left 06/02/23  At groin      30 cm proximal to suprapatella      20 cm  proximal to suprapatella      10 cm proximal to suprapatella      At midpatella / popliteal crease      30 cm proximal to floor at lateral plantar foot   49.2 46 46.3  20 cm proximal to floor at lateral plantar foot 37.6 34.7 35  10 cm proximal to floor at lateral plantar foot 32.4 30.5 29.5  Circumference of ankle/heel 40.5 37 36.8  5 cm proximal to 1st MTP joint 27.2 26 26.4  Across MTP joint 26.2 25 25.3  Around proximal great toe        TODAY'S TREATMENT:                                                                                                                              DATE:  06/02/23: Measurements Manual:  cervical, axillary, superficial and deep abdominal, Lt inguinal to axillary anastomosis and Lt LE with posterior completed in prone.   Instructed donning compression garment with butler  05/26/23 Manual to include: cervical, axillary, superficial and deep abdominal, Lt inguinal to axillary anastomosis and Lt LE with posterior completed in prone.  Instruction of donning compression garment with butler.  Education to complete diaphragm breathing x 5 minutes a day and to order compression t-shirts.  05/19/23 Measured LE, encouraged pt to use the pump more than once a day.  Encouraged patient to return with the 20-38mmhg compression garment so we can use the butler and trouble shoot taking the garment on and off, review of exercises.    PATIENT EDUCATION:  Education details: 10/16: use of a butler how to doff compression garment and using compression t-shirt Person educated: Patient Education method: Explanation and Verbal cues Education comprehension: verbalized understanding  HOME EXERCISE PROGRAM: Pt has at home.   ASSESSMENT:  CLINICAL IMPRESSION:  Pt's 3rd visit in 2 weeks.  Reviewed importance of compliance for maximal benefits, pt stated he had dentist apt and therapist cancelled apt last Friday.  Has schedule for 3 days weeks in future.  Measurements taken  minimal changes, some reduction and some increased volume.  Manual decongestive manual techniques.  Pt educated on self manual and given handout.  Reviewed mechanics with donning compression garments, encouraged to see heel of compression garments prior placing foot in butler for increased ease.    Eval:  Patient is a 62 y.o. m who was seen today for physical therapy evaluation and treatment for Lt lymphedema.   He has been to therapy before and his leg volume decreased but after he was discharged the edema returned.  The pt states he can not tolerate the 20-44mmhg.  Therapist explained that for lymphedema this is the pressure that he really needs to have.  PT will bring his garment in to ensure proper fitting next treatment.Therapist feels pt will benefit from pumping at multiple times of the day..  Evaluation demonstrates increased induration indicative of lymphedema.  Mr. Acri will benefit from skilled PT to address these issues and reduce his leg to normal size to reduce the risk of cellulitis.     OBJECTIVE IMPAIRMENTS: increased edema.   ACTIVITY LIMITATIONS: locomotion level  REHAB POTENTIAL: Good  CLINICAL DECISION MAKING: Evolving/moderate complexity  EVALUATION COMPLEXITY: Low  GOALS: Goals reviewed with patient? No  SHORT TERM GOALS: Target date: 10/18  PT measurement to decrease by 2 cm at the 30 and 20 mark Baseline: Goal status: on-going   2.  PT to be able done and doff 20-30 mmhg compression.  Baseline:  Goal status: on-going    LONG TERM GOALS: Target date: 06/09/23  PT Lt LE measurements  to be within 1-2 cm of his RT to decrease risk of cellulitis and increase ease of donning and doffing compression garment.  Baseline:  Goal status: on-going   PLAN:  PT FREQUENCY: 3x/week  PT DURATION: 3 weeks  PLANNED INTERVENTIONS: Patient/Family education, Self Care, Manual lymph drainage, and Compression bandaging if needed.   PLAN FOR NEXT SESSION: Begin manual  decongestive technique for Lt LE, don 20-30mmhg compression and have pt doff and re-don.    Becky Sax, LPTA/CLT; CBIS [phone]  Juel Burrow, PTA 06/02/2023, 3:16 PM  06/02/2023, 3:16 PM

## 2023-06-03 ENCOUNTER — Ambulatory Visit: Payer: Medicaid Other | Admitting: Internal Medicine

## 2023-06-03 ENCOUNTER — Encounter: Payer: Self-pay | Admitting: Internal Medicine

## 2023-06-03 VITALS — BP 126/80 | HR 93 | Temp 98.8°F | Ht 69.0 in | Wt 301.0 lb

## 2023-06-03 DIAGNOSIS — R600 Localized edema: Secondary | ICD-10-CM

## 2023-06-03 DIAGNOSIS — E559 Vitamin D deficiency, unspecified: Secondary | ICD-10-CM | POA: Diagnosis not present

## 2023-06-03 DIAGNOSIS — I10 Essential (primary) hypertension: Secondary | ICD-10-CM | POA: Diagnosis not present

## 2023-06-03 DIAGNOSIS — G5601 Carpal tunnel syndrome, right upper limb: Secondary | ICD-10-CM | POA: Diagnosis not present

## 2023-06-03 MED ORDER — TORSEMIDE 20 MG PO TABS
20.0000 mg | ORAL_TABLET | Freq: Every day | ORAL | 1 refills | Status: DC
Start: 2023-06-03 — End: 2023-10-27

## 2023-06-03 NOTE — Patient Instructions (Signed)
It was a pleasure to see you today.  Thank you for giving [place] the opportunity to be involved in your care.  Below is a brief recap of your visit and next steps.  We will plan to see you again in 6 months.  Summary No medication changes today Repeat labs ordered Follow up with orthopedic surgery about regarding your hand

## 2023-06-03 NOTE — Assessment & Plan Note (Signed)
Adequately controlled on current antihypertensive regimen.  Repeat BMP ordered today given previous medication changes.

## 2023-06-03 NOTE — Assessment & Plan Note (Signed)
Noted on previous labs.  He is currently prescribed daily vitamin D supplementation.  Repeat vitamin D level ordered today.

## 2023-06-03 NOTE — Assessment & Plan Note (Signed)
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  He is experiencing paresthesias in the fifth digit of his right hand.  He plans to contact orthopedic surgery to arrange follow-up for further management.

## 2023-06-03 NOTE — Progress Notes (Signed)
Established Patient Office Visit  Subjective   Patient ID: Sean Mccarty, male    DOB: 1961-02-02  Age: 62 y.o. MRN: [id number]  Chief Complaint  Patient presents with   Medical Management of Chronic Issues   Mr. Sean Mccarty returns to care today for routine follow-up.  He was last evaluated by me on 6/10 for HTN follow-up.  Amlodipine was discontinued in case it was contributing to lower extremity edema.  HCTZ was increased to 25 mg daily.  28-month follow-up was arranged.  In the interim, he has been evaluated by orthopedic surgery for persistent pain in the fifth digit of his right hand.  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  He has also establish care with physical therapy for management of lymphedema.  There have otherwise been no acute interval events.  Mr. Sean Mccarty reports feeling fairly well today.  He continues to experience paresthesias in the fifth digit of his right hand.  He plans to contact orthopedic surgery to arrange follow-up for treatment.  He does not have any additional concerns to discuss today.  Past Medical History:  Diagnosis Date   Chest pain 05/11/2012   HTN (hypertension)    Hyperlipidemia    Prediabetes    Snoring 05/11/2012   Past Surgical History:  Procedure Laterality Date   BACK SURGERY     TESTICLE SURGERY     Social History   Tobacco Use   Smoking status: Never   Smokeless tobacco: Never  Substance Use Topics   Alcohol use: No   Drug use: No   History reviewed. No pertinent family history. No Known Allergies  Review of Systems  Neurological:  Positive for tingling (Paresthesias in fifth digit of right hand).  All other systems reviewed and are negative.    Objective:     BP 126/80   Pulse 93   Temp 98.8 F (37.1 C)   Ht 5\' 9"  (1.753 m)   Wt (!) 301 lb (136.5 kg)   SpO2 94%   BMI 44.45 kg/m  BP Readings from Last 3 Encounters:  06/03/23 126/80   03/31/23 (!) 136/92  03/22/23 (!) 144/82   Physical Exam Vitals reviewed.  Constitutional:      General: He is not in acute distress.    Appearance: Normal appearance. He is obese. He is not ill-appearing.  HENT:     Head: Normocephalic and atraumatic.     Right Ear: External ear normal.     Left Ear: External ear normal.     Nose: Nose normal. No congestion or rhinorrhea.     Mouth/Throat:     Mouth: Mucous membranes are moist.     Pharynx: Oropharynx is clear.  Eyes:     General: No scleral icterus.    Extraocular Movements: Extraocular movements intact.     Conjunctiva/sclera: Conjunctivae normal.     Pupils: Pupils are equal, round, and reactive to light.  Cardiovascular:     Rate and Rhythm: Normal rate and regular rhythm.     Pulses: Normal pulses.     Heart sounds: Normal heart sounds. No murmur heard. Pulmonary:     Effort: Pulmonary effort is normal.     Breath sounds: Normal breath sounds. No wheezing, rhonchi or rales.  Abdominal:     General: Abdomen is flat. Bowel sounds are normal. There is no distension.     Palpations: Abdomen is soft.     Tenderness: There  is no abdominal tenderness.  Musculoskeletal:        General: No swelling or deformity. Normal range of motion.     Cervical back: Normal range of motion.     Right lower leg: Edema (Trace) present.     Left lower leg: Edema (Trace) present.  Skin:    General: Skin is warm and dry.     Capillary Refill: Capillary refill takes less than 2 seconds.  Neurological:     General: No focal deficit present.     Mental Status: He is alert and oriented to person, place, and time.     Motor: No weakness.  Psychiatric:        Mood and Affect: Mood normal.        Behavior: Behavior normal.        Thought Content: Thought content normal.   Last CBC Lab Results  Component Value Date   WBC 4.8 11/10/2022   HGB 14.5 11/10/2022   HCT 44.6 11/10/2022   MCV 88 11/10/2022   MCH 28.7 11/10/2022   RDW 14.0  11/10/2022   PLT 170 11/10/2022   Last metabolic panel Lab Results  Component Value Date   GLUCOSE 106 (H) 11/10/2022   NA 138 11/10/2022   K 4.1 11/10/2022   CL 103 11/10/2022   CO2 20 11/10/2022   BUN 15 11/10/2022   CREATININE 1.03 11/10/2022   EGFR 82 11/10/2022   CALCIUM 9.1 11/10/2022   PROT 6.6 11/10/2022   ALBUMIN 4.0 11/10/2022   LABGLOB 2.6 11/10/2022   AGRATIO 1.5 11/10/2022   BILITOT 1.0 11/10/2022   ALKPHOS 46 11/10/2022   AST 21 11/10/2022   ALT 21 11/10/2022   Last lipids Lab Results  Component Value Date   CHOL 156 11/10/2022   HDL 37 (L) 11/10/2022   LDLCALC 98 11/10/2022   TRIG 113 11/10/2022   CHOLHDL 4.2 11/10/2022   Last hemoglobin A1c Lab Results  Component Value Date   HGBA1C 6.1 (H) 11/10/2022   Last thyroid functions Lab Results  Component Value Date   TSH 2.790 11/10/2022   Last vitamin D Lab Results  Component Value Date   VD25OH 15.5 (L) 11/10/2022    The 10-year ASCVD risk score (Arnett DK, et al., 2019) is: 13.3%    Assessment & Plan:   Problem List Items Addressed This Visit       HTN (hypertension) - Primary    Adequately controlled on current antihypertensive regimen.  Repeat BMP ordered today given previous medication changes.      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  He is experiencing paresthesias in the fifth digit of his right hand.  He plans to contact orthopedic surgery to arrange follow-up for further management.      Vitamin D deficiency    Noted on previous labs.  He is currently prescribed daily vitamin D supplementation.  Repeat vitamin D level ordered today.      Return in about 6 months (around 12/02/2023).   Billie Lade, MD

## 2023-06-04 ENCOUNTER — Other Ambulatory Visit: Payer: Self-pay | Admitting: Internal Medicine

## 2023-06-04 ENCOUNTER — Encounter (HOSPITAL_COMMUNITY): Payer: Medicaid Other

## 2023-06-04 DIAGNOSIS — N179 Acute kidney failure, unspecified: Secondary | ICD-10-CM

## 2023-06-04 LAB — BASIC METABOLIC PANEL
BUN/Creatinine Ratio: 12 (ref 10–24)
BUN: 17 mg/dL (ref 8–27)
CO2: 24 mmol/L (ref 20–29)
Calcium: 9.3 mg/dL (ref 8.6–10.2)
Chloride: 102 mmol/L (ref 96–106)
Creatinine, Ser: 1.39 mg/dL — ABNORMAL HIGH (ref 0.76–1.27)
Glucose: 96 mg/dL (ref 70–99)
Potassium: 4.2 mmol/L (ref 3.5–5.2)
Sodium: 140 mmol/L (ref 134–144)
eGFR: 57 mL/min/{1.73_m2} — ABNORMAL LOW (ref >59)

## 2023-06-04 LAB — VITAMIN D 25 HYDROXY (VIT D DEFICIENCY, FRACTURES): Vit D, 25-Hydroxy: 26.4 ng/mL — ABNORMAL LOW (ref 30.0–100.0)

## 2023-06-07 ENCOUNTER — Ambulatory Visit (HOSPITAL_COMMUNITY): Payer: Medicaid Other | Admitting: Physical Therapy

## 2023-06-07 ENCOUNTER — Encounter (HOSPITAL_COMMUNITY): Payer: Medicaid Other | Admitting: Physical Therapy

## 2023-06-07 DIAGNOSIS — I890 Lymphedema, not elsewhere classified: Secondary | ICD-10-CM | POA: Diagnosis not present

## 2023-06-07 NOTE — Therapy (Signed)
OUTPATIENT PHYSICAL THERAPY LYMPHEDEMA Treatment   Patient Name: Sean Mccarty MRN: [id number] DOB:Oct 10, 1960, 62 y.o., male Today's Date: 06/07/2023  END OF SESSION:  PT End of Session - 06/07/23 1648     Visit Number 4    Number of Visits 9    Date for PT Re-Evaluation 06/09/23    Authorization Type medicaid healthy    Authorization Time Period 4 visits approved 10/9 thru 11/7    Authorization - Visit Number 3    Authorization - Number of Visits 4    Progress Note Due on Visit 5    PT Start Time 1135    PT Stop Time 1215    PT Time Calculation (min) 40 min    Activity Tolerance Patient tolerated treatment well    Behavior During Therapy [facility] for tasks assessed/performed                Past Medical History:  Diagnosis Date   Chest pain 05/11/2012   HTN (hypertension)    Hyperlipidemia    Prediabetes    Snoring 05/11/2012   Past Surgical History:  Procedure Laterality Date   BACK SURGERY     TESTICLE SURGERY     Patient Active Problem List   Diagnosis Date Noted   Vitamin D deficiency 06/03/2023   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   Exertional dyspnea 03/22/2023   Localized skin eruption 03/22/2023   Right hand pain 03/08/2023   Pain of finger of right hand 02/24/2023   Edema of left lower extremity 11/09/2022   Colon cancer screening 11/09/2022   Encounter for general adult medical examination with abnormal findings 11/09/2022   Morbid obesity (HCC) 11/09/2022   HTN (hypertension) 11/09/2022   HLD (hyperlipidemia) 11/09/2022   Chest pain 05/11/2012   Snoring 05/11/2012    PCP: Delmar Landau   REFERRING PROVIDER: Delmar Landau    REFERRING DIAG: edema  THERAPY DIAG:  Lymphedema, not elsewhere classified  Rationale for Evaluation and Treatment: Rehabilitation  ONSET DATE: February  SUBJECTIVE:                                                                                                                                                                                            SUBJECTIVE STATEMENT: Pt arrived without garment.  States the one he was told to order is too tight.  Brought it with him today.    Pt states that he had cellulitis in February.  He had therapy and the  swelling went down with compression bandaging.  He was advised to exercise and wear 20-30 mm hg compression garments which he did.  He states that he could barely get the garment on, the garment was painful and he could not get the garment off so he cut it off.  The pt started to swell without the compression garment and he called this department and spoke to this therapist.  On the date of the call the therapist ordered a compression pump for the pt which the  pt states that  received.  He is pumping once a day and his leg has gone down, although it continues to be swollen.    He currently is wearing 15-20 mm hg compression. PERTINENT HISTORY: as above  PAIN:  Are you having pain? No  PRECAUTIONS: Other: celllulitis    WEIGHT BEARING RESTRICTIONS: No  FALLS:  Has patient fallen in last 6 months? No    PRIOR LEVEL OF FUNCTION: Independent  PATIENT GOALS: less swelling    OBJECTIVE: Note: Objective measures were completed at Evaluation unless otherwise noted.  COGNITION: Overall cognitive status: Within functional limits for tasks assessed   PALPATION: Noted induration  LYMPHEDEMA ASSESSMENTS:   LE LANDMARK RIGHT 04/15/23 Eval 05/19/23  At groin     30 cm proximal to suprapatella     20 cm proximal to suprapatella     10 cm proximal to suprapatella     At midpatella / popliteal crease     30 cm proximal to floor at lateral plantar foot 41.9   20 cm proximal to floor at lateral plantar foot 31.6   10 cm proximal to floor at lateral plantar foot 27.4   Circumference of ankle/heel 36   5 cm proximal to 1st MTP joint 26.8   Across MTP joint 26.8   Around proximal great toe     (Blank rows = not tested)   LE LANDMARK  LEFT 04/15/23 Eval 05/19/23 Left 06/02/23  At groin      30 cm proximal to suprapatella      20 cm proximal to suprapatella      10 cm proximal to suprapatella      At midpatella / popliteal crease      30 cm proximal to floor at lateral plantar foot   49.2 46 46.3  20 cm proximal to floor at lateral plantar foot 37.6 34.7 35  10 cm proximal to floor at lateral plantar foot 32.4 30.5 29.5  Circumference of ankle/heel 40.5 37 36.8  5 cm proximal to 1st MTP joint 27.2 26 26.4  Across MTP joint 26.2 25 25.3  Around proximal great toe        TODAY'S TREATMENT:                                                                                                                              DATE:  06/07/23: Manual:  cervical, axillary, superficial and deep abdominal, Lt inguinal to axillary anastomosis and Lt LE with posterior completed in prone.   Self care:  Demonstrated, Instructed, assisted with  donning compression garment with butler  06/02/23: Measurements Manual:  cervical, axillary, superficial and deep abdominal, Lt inguinal to axillary anastomosis and Lt LE with posterior completed in prone.   Instructed donning compression garment with butler  05/26/23 Manual to include: cervical, axillary, superficial and deep abdominal, Lt inguinal to axillary anastomosis and Lt LE with posterior completed in prone.  Instruction of donning compression garment with butler.  Education to complete diaphragm breathing x 5 minutes a day and to order compression t-shirts.  05/19/23 Measured LE, encouraged pt to use the pump more than once a day.  Encouraged patient to return with the 20-25mmhg compression garment so we can use the butler and trouble shoot taking the garment on and off, review of exercises.    PATIENT EDUCATION:  Education details: 10/16: use of a butler how to doff compression garment and using compression t-shirt Person educated: Patient Education method: Explanation and Verbal  cues Education comprehension: verbalized understanding  HOME EXERCISE PROGRAM: Pt has at home.   ASSESSMENT:  CLINICAL IMPRESSION:  Pt reports frustration with new garment as it is uncomfortable and tight.  Educated to only don first thing in the morning and to keep on all day, wear everyday.  Manual completed for Lt LE lymphedema.  Demonstrated how he will need to keep working with stocking until figures out exactly how far to put on butler, however worked well today when pushed to just below heel.  Pt admits he doesn't think he was wearing it right with it too far up into back of knee and with wrinkles in it. Pt should be ready for discharge next visit.    Eval:  Patient is a 62 y.o. m who was seen today for physical therapy evaluation and treatment for Lt lymphedema.   He has been to therapy before and his leg volume decreased but after he was discharged the edema returned.  The pt states he can not tolerate the 20-35mmhg.  Therapist explained that for lymphedema this is the pressure that he really needs to have.  PT will bring his garment in to ensure proper fitting next treatment.Therapist feels pt will benefit from pumping at multiple times of the day..  Evaluation demonstrates increased induration indicative of lymphedema.  Mr. Lomboy will benefit from skilled PT to address these issues and reduce his leg to normal size to reduce the risk of cellulitis.     OBJECTIVE IMPAIRMENTS: increased edema.   ACTIVITY LIMITATIONS: locomotion level  REHAB POTENTIAL: Good  CLINICAL DECISION MAKING: Evolving/moderate complexity  EVALUATION COMPLEXITY: Low  GOALS: Goals reviewed with patient? No  SHORT TERM GOALS: Target date: 10/18  PT measurement to decrease by 2 cm at the 30 and 20 mark Baseline: Goal status: on-going   2.  PT to be able done and doff 20-30 mmhg compression.  Baseline:  Goal status: on-going    LONG TERM GOALS: Target date: 06/09/23  PT Lt LE measurements  to be  within 1-2 cm of his RT to decrease risk of cellulitis and increase ease of donning and doffing compression garment.  Baseline:  Goal status: on-going   PLAN:  PT FREQUENCY: 3x/week  PT DURATION: 3 weeks  PLANNED INTERVENTIONS: Patient/Family education, Self Care, Manual lymph drainage, and Compression bandaging if needed.   PLAN FOR NEXT SESSION: continue with manual decongestive technique for Lt LE, don 20-30mmhg compression and have pt doff and re-don.  Reassess next visit.   Lurena Nida, PTA 06/07/2023, 4:49 PM  06/07/2023, 4:49 PM

## 2023-06-09 ENCOUNTER — Ambulatory Visit (HOSPITAL_COMMUNITY): Payer: Medicaid Other | Admitting: Physical Therapy

## 2023-06-09 ENCOUNTER — Encounter (HOSPITAL_COMMUNITY): Payer: Medicaid Other | Admitting: Physical Therapy

## 2023-06-09 DIAGNOSIS — I890 Lymphedema, not elsewhere classified: Secondary | ICD-10-CM | POA: Diagnosis not present

## 2023-06-09 NOTE — Therapy (Signed)
OUTPATIENT PHYSICAL THERAPY LYMPHEDEMA Treatment /Discharge  Patient Name: Sean Mccarty MRN: [id number] DOB:Jun 13, 1961, 62 y.o., male Today's Date: 06/09/2023 PHYSICAL THERAPY DISCHARGE SUMMARY  Visits from Start of Care: 5  Current functional level related to goals / functional outcomes: PT continues to have swelling but will not comply with wearing compression garment.    Remaining deficits: edena   Education / Equipment: Self manual, LE exercises and compression pump   Patient agrees to discharge. Patient goals were met. Patient is being discharged due to lack of progress.  END OF SESSION:  PT End of Session - 06/09/23 1338     Visit Number 5    Number of Visits 9    Date for PT Re-Evaluation 06/09/23    Authorization Type medicaid healthy    Authorization Time Period 4 visits approved 10/9 thru 11/7    Authorization - Visit Number 4    Authorization - Number of Visits 4    Progress Note Due on Visit 5    PT Start Time 1305    PT Stop Time 1345    PT Time Calculation (min) 40 min    Activity Tolerance Patient tolerated treatment well    Behavior During Therapy [facility] for tasks assessed/performed                 Past Medical History:  Diagnosis Date   Chest pain 05/11/2012   HTN (hypertension)    Hyperlipidemia    Prediabetes    Snoring 05/11/2012   Past Surgical History:  Procedure Laterality Date   BACK SURGERY     TESTICLE SURGERY     Patient Active Problem List   Diagnosis Date Noted   Vitamin D deficiency 06/03/2023   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   Exertional dyspnea 03/22/2023   Localized skin eruption 03/22/2023   Right hand pain 03/08/2023   Pain of finger of right hand 02/24/2023   Edema of left lower extremity 11/09/2022   Colon cancer screening 11/09/2022   Encounter for general adult medical examination with abnormal findings 11/09/2022   Morbid obesity (HCC) 11/09/2022   HTN (hypertension) 11/09/2022   HLD  (hyperlipidemia) 11/09/2022   Chest pain 05/11/2012   Snoring 05/11/2012    PCP: Delmar Landau   REFERRING PROVIDER: Delmar Landau    REFERRING DIAG: edema  THERAPY DIAG:  Lymphedema, not elsewhere classified  Rationale for Evaluation and Treatment: Rehabilitation  ONSET DATE: February  SUBJECTIVE:  SUBJECTIVE STATEMENT: Pt arrived without garment.  Pt states that he had to shower this morning; pt appointment is at 1:00 PM.  Pt has not obtained compression t-shirt as he lives by himself and can not get it off.  Therapist recommended a lumbar support brace as it is Velcro.    PERTINENT HISTORY: as above  PAIN:  Are you having pain? No  PRECAUTIONS: Other: celllulitis    WEIGHT BEARING RESTRICTIONS: No  FALLS:  Has patient fallen in last 6 months? No    PRIOR LEVEL OF FUNCTION: Independent  PATIENT GOALS: less swelling    OBJECTIVE: Note: Objective measures were completed at Evaluation unless otherwise noted.  COGNITION: Overall cognitive status: Within functional limits for tasks assessed   PALPATION: Noted induration  LYMPHEDEMA ASSESSMENTS:   LE LANDMARK RIGHT 04/15/23 Eval 05/19/23 Right  06/08/30  At groin      30 cm proximal to suprapatella      20 cm proximal to suprapatella      10 cm proximal to suprapatella      At midpatella / popliteal crease      30 cm proximal to floor at lateral plantar foot 41.9   45.5  20 cm proximal to floor at lateral plantar foot 31.6  32.5  10 cm proximal to floor at lateral plantar foot 27.4  28  Circumference of ankle/heel 36  36.9  5 cm proximal to 1st MTP joint 26.8  26  Across MTP joint 26.8  25.7  Around proximal great toe      (Blank rows = not tested)   LE LANDMARK LEFT 04/15/23 Eval 05/19/23  Left 06/02/23 Left 10/30/  At  groin        30 cm proximal to suprapatella        20 cm proximal to suprapatella        10 cm proximal to suprapatella        At midpatella / popliteal crease        30 cm proximal to floor at lateral plantar foot   49.2 46  46.3 47  20 cm proximal to floor at lateral plantar foot 37.6 34.7  35 35.7  10 cm proximal to floor at lateral plantar foot 32.4 30.5  29.5 31.3  Circumference of ankle/heel 40.5 37  36.8 38.4  5 cm proximal to 1st MTP joint 27.2 26  26.4 28.2  Across MTP joint 26.2 25  25.3 26.3   Around proximal great toe          TODAY'S TREATMENT:                                                                                                                              DATE:  06/09/23 Manual:  cervical, axillary, superficial and deep abdominal, Lt inguinal to axillary anastomosis and Lt LE with posterior completed in prone.   Self care:  Demonstrated, Instructed, assisted with  donning compression garment  with butler 06/07/23: Manual:  cervical, axillary, superficial and deep abdominal, Lt inguinal to axillary anastomosis and Lt LE with posterior completed in prone.   Self care:  Demonstrated, Instructed, assisted with  donning compression garment with butler ATIENT EDUCATION:  Education details: 10/16: use of a butler how to doff compression garment and using compression t-shirt Person educated: Patient Education method: Explanation and Verbal cues Education comprehension: verbalized understanding  HOME EXERCISE PROGRAM: Pt has at home.   ASSESSMENT:  CLINICAL IMPRESSION:Pt continues to come to clinic without garment donned.  States he showered this morning.  Explained to pt that this was five hours ago and patient's with lymphedema will swell within five hours.  Pt verbalized understanding.  Therapist explained to pt that we have educated him on all aspects and it is now up to him.  Pt agrees.  PT given D/C instructions for lymphedema.    OBJECTIVE IMPAIRMENTS:  increased edema.   ACTIVITY LIMITATIONS: locomotion level  REHAB POTENTIAL: Good  CLINICAL DECISION MAKING: Evolving/moderate complexity  EVALUATION COMPLEXITY: Low  GOALS: Goals reviewed with patient? No  SHORT TERM GOALS: Target date: 10/18  PT measurement to decrease by 2 cm at the 30 and 20 mark Baseline: Goal status: on-going   2.  PT to be able done and doff 20-30 mmhg compression.  Baseline:  Goal status: on-going    LONG TERM GOALS: Target date: 06/09/23  PT Lt LE measurements  to be within 1-2 cm of his RT to decrease risk of cellulitis and increase ease of donning and doffing compression garment.  Baseline:  Goal status: on-going   PLAN:  PT FREQUENCY: 3x/week  PT DURATION: 3 weeks  PLANNED INTERVENTIONS: Patient/Family education, Self Care, Manual lymph drainage, and Compression bandaging if needed.   PLAN FOR NEXT SESSION: Discharge to self care.  [phone]  06/09/2023, 3:26 PM

## 2023-06-11 ENCOUNTER — Encounter (HOSPITAL_COMMUNITY): Payer: Medicaid Other | Admitting: Physical Therapy

## 2023-06-14 ENCOUNTER — Encounter (HOSPITAL_COMMUNITY): Payer: Medicaid Other | Admitting: Physical Therapy

## 2023-06-16 ENCOUNTER — Encounter (HOSPITAL_COMMUNITY): Payer: Medicaid Other

## 2023-06-18 ENCOUNTER — Encounter (HOSPITAL_COMMUNITY): Payer: Medicaid Other | Admitting: Physical Therapy

## 2023-06-21 ENCOUNTER — Encounter (HOSPITAL_COMMUNITY): Payer: Medicaid Other | Admitting: Physical Therapy

## 2023-06-23 ENCOUNTER — Encounter (HOSPITAL_COMMUNITY): Payer: Medicaid Other | Admitting: Physical Therapy

## 2023-06-25 ENCOUNTER — Encounter (HOSPITAL_COMMUNITY): Payer: Medicaid Other | Admitting: Physical Therapy

## 2023-10-27 ENCOUNTER — Other Ambulatory Visit: Payer: Self-pay | Admitting: Internal Medicine

## 2023-10-27 DIAGNOSIS — R600 Localized edema: Secondary | ICD-10-CM

## 2023-10-27 MED ORDER — TORSEMIDE 20 MG PO TABS: 20.0000 mg | ORAL_TABLET | Freq: Every day | ORAL | 1 refills | Status: DC

## 2023-10-27 NOTE — Telephone Encounter (Signed)
 Copied from CRM [id number]. Topic: Clinical - Medication Refill >> Oct 27, 2023  4:05 PM Truddie Crumble wrote: Most Recent Primary Care Visit:  Provider: Christel Mormon E  Department: [facility] [place]  Visit Type: OFFICE VISIT  Date: 06/03/2023  Medication: torsemide (DEMADEX) 20 MG tablet  Has the patient contacted their pharmacy? No (Agent: If no, request that the patient contact the pharmacy for the refill. If patient does not wish to contact the pharmacy document the reason why and proceed with request.) (Agent: If yes, when and what did the pharmacy advise?)  Is this the correct pharmacy for this prescription? Yes If no, delete pharmacy and type the correct one.  This is the patient's preferred pharmacy:   [facility] [address] [place] - [address] [place] [address] [place] [address] Phone: [phone] Fax: [phone]  Has the prescription been filled recently? No  Is the patient out of the medication? Yes  Has the patient been seen for an appointment in the last year OR does the patient have an upcoming appointment? Yes  Can we respond through MyChart? No  Agent: Please be advised that Rx refills may take up to 3 business days. We ask that you follow-up with your pharmacy.

## 2023-12-02 ENCOUNTER — Ambulatory Visit: Payer: Medicaid Other | Admitting: Internal Medicine

## 2023-12-15 ENCOUNTER — Ambulatory Visit: Admitting: Internal Medicine

## 2023-12-15 ENCOUNTER — Encounter: Payer: Self-pay | Admitting: Internal Medicine

## 2023-12-15 VITALS — BP 134/78 | HR 87 | Ht 69.0 in | Wt 309.4 lb

## 2023-12-15 DIAGNOSIS — F419 Anxiety disorder, unspecified: Secondary | ICD-10-CM

## 2023-12-15 DIAGNOSIS — E785 Hyperlipidemia, unspecified: Secondary | ICD-10-CM | POA: Diagnosis not present

## 2023-12-15 DIAGNOSIS — Z1211 Encounter for screening for malignant neoplasm of colon: Secondary | ICD-10-CM

## 2023-12-15 DIAGNOSIS — I10 Essential (primary) hypertension: Secondary | ICD-10-CM | POA: Diagnosis not present

## 2023-12-15 DIAGNOSIS — E559 Vitamin D deficiency, unspecified: Secondary | ICD-10-CM | POA: Diagnosis not present

## 2023-12-15 DIAGNOSIS — R7303 Prediabetes: Secondary | ICD-10-CM

## 2023-12-15 DIAGNOSIS — Z125 Encounter for screening for malignant neoplasm of prostate: Secondary | ICD-10-CM

## 2023-12-15 MED ORDER — HYDROXYZINE PAMOATE 25 MG PO CAPS: 25.0000 mg | ORAL_CAPSULE | Freq: Three times a day (TID) | ORAL | 0 refills | Status: DC | PRN

## 2023-12-15 NOTE — Progress Notes (Unsigned)
   Established Patient Office Visit  Subjective   Patient ID: Sean Mccarty, male    DOB: 10/06/1960  Age: 63 y.o. MRN: [id number]  Chief Complaint  Patient presents with   Hypertension    Six month follow up    Anxiety    Patient states his nerves get to him more now , he is real nervous and jittery    Sean Mccarty returns to care today for routine follow-up.  He was last evaluated by me in October 2024.  No medication changes were made at that time and 45-month follow-up was arranged.  In the interim he has been seen by physical therapy.  There have otherwise been no acute interval events.  Past Medical History:  Diagnosis Date   Chest pain 05/11/2012   HTN (hypertension)    Hyperlipidemia    Prediabetes    Snoring 05/11/2012   Past Surgical History:  Procedure Laterality Date   BACK SURGERY     TESTICLE SURGERY     Social History   Tobacco Use   Smoking status: Never   Smokeless tobacco: Never  Substance Use Topics   Alcohol use: No   Drug use: No   No family history on file. No Known Allergies  ROS    Objective:     BP (!) 146/89   Pulse 87   Ht 5\' 9"  (1.753 m)   Wt (!) 309 lb 6.4 oz (140.3 kg)   SpO2 94%   BMI 45.69 kg/m  BP Readings from Last 3 Encounters:  12/15/23 (!) 146/89  06/03/23 126/80  03/31/23 (!) 136/92   Physical Exam  Last CBC Lab Results  Component Value Date   WBC 4.8 11/10/2022   HGB 14.5 11/10/2022   HCT 44.6 11/10/2022   MCV 88 11/10/2022   MCH 28.7 11/10/2022   RDW 14.0 11/10/2022   PLT 170 11/10/2022   Last metabolic panel Lab Results  Component Value Date   GLUCOSE 96 06/03/2023   NA 140 06/03/2023   K 4.2 06/03/2023   CL 102 06/03/2023   CO2 24 06/03/2023   BUN 17 06/03/2023   CREATININE 1.39 (H) 06/03/2023   EGFR 57 (L) 06/03/2023   CALCIUM  9.3 06/03/2023   PROT 6.6 11/10/2022   ALBUMIN 4.0 11/10/2022   LABGLOB 2.6 11/10/2022   AGRATIO 1.5 11/10/2022   BILITOT 1.0 11/10/2022   ALKPHOS 46 11/10/2022   AST  21 11/10/2022   ALT 21 11/10/2022   Last lipids Lab Results  Component Value Date   CHOL 156 11/10/2022   HDL 37 (L) 11/10/2022   LDLCALC 98 11/10/2022   TRIG 113 11/10/2022   CHOLHDL 4.2 11/10/2022   Last hemoglobin A1c Lab Results  Component Value Date   HGBA1C 6.1 (H) 11/10/2022   Last thyroid functions Lab Results  Component Value Date   TSH 2.790 11/10/2022   Last vitamin D  Lab Results  Component Value Date   VD25OH 26.4 (L) 06/03/2023   The 10-year ASCVD risk score (Arnett DK, et al., 2019) is: 17%    Assessment & Plan:   Problem List Items Addressed This Visit   None   No follow-ups on file.    Tobi Fortes, MD

## 2023-12-15 NOTE — Patient Instructions (Signed)
 It was a pleasure to see you today.  Thank you for giving us  the opportunity to be involved in your care.  Below is a brief recap of your visit and next steps.  We will plan to see you again in 6 months.  Summary Add hydroxyzine for anxiety relief today Repeat labs ordered Cologuard ordered Recommend increasing daily fiber intake Follow up in 6 months

## 2023-12-16 ENCOUNTER — Other Ambulatory Visit: Payer: Self-pay | Admitting: Internal Medicine

## 2023-12-16 DIAGNOSIS — R7303 Prediabetes: Secondary | ICD-10-CM | POA: Insufficient documentation

## 2023-12-16 DIAGNOSIS — E782 Mixed hyperlipidemia: Secondary | ICD-10-CM

## 2023-12-16 DIAGNOSIS — F419 Anxiety disorder, unspecified: Secondary | ICD-10-CM | POA: Insufficient documentation

## 2023-12-16 LAB — CMP14+EGFR
ALT: 36 IU/L (ref 0–44)
AST: 38 IU/L (ref 0–40)
Albumin: 4.4 g/dL (ref 3.9–4.9)
Alkaline Phosphatase: 57 IU/L (ref 44–121)
BUN/Creatinine Ratio: 11 (ref 10–24)
BUN: 17 mg/dL (ref 8–27)
Bilirubin Total: 1.8 mg/dL — ABNORMAL HIGH (ref 0.0–1.2)
CO2: 23 mmol/L (ref 20–29)
Calcium: 9.6 mg/dL (ref 8.6–10.2)
Chloride: 100 mmol/L (ref 96–106)
Creatinine, Ser: 1.5 mg/dL — ABNORMAL HIGH (ref 0.76–1.27)
Globulin, Total: 2.6 g/dL (ref 1.5–4.5)
Glucose: 107 mg/dL — ABNORMAL HIGH (ref 70–99)
Potassium: 3.7 mmol/L (ref 3.5–5.2)
Sodium: 142 mmol/L (ref 134–144)
Total Protein: 7 g/dL (ref 6.0–8.5)
eGFR: 52 mL/min/{1.73_m2} — ABNORMAL LOW (ref >59)

## 2023-12-16 LAB — CBC WITH DIFFERENTIAL/PLATELET
Basophils Absolute: 0 10*3/uL (ref 0.0–0.2)
Basos: 1 %
EOS (ABSOLUTE): 0.2 10*3/uL (ref 0.0–0.4)
Eos: 3 %
Hematocrit: 51.7 % — ABNORMAL HIGH (ref 37.5–51.0)
Hemoglobin: 17.3 g/dL (ref 13.0–17.7)
Immature Grans (Abs): 0 10*3/uL (ref 0.0–0.1)
Immature Granulocytes: 0 %
Lymphocytes Absolute: 2.7 10*3/uL (ref 0.7–3.1)
Lymphs: 39 %
MCH: 30.3 pg (ref 26.6–33.0)
MCHC: 33.5 g/dL (ref 31.5–35.7)
MCV: 91 fL (ref 79–97)
Monocytes Absolute: 0.6 10*3/uL (ref 0.1–0.9)
Monocytes: 9 %
Neutrophils Absolute: 3.4 10*3/uL (ref 1.4–7.0)
Neutrophils: 48 %
Platelets: 193 10*3/uL (ref 150–450)
RBC: 5.71 x10E6/uL (ref 4.14–5.80)
RDW: 13.8 % (ref 11.6–15.4)
WBC: 6.9 10*3/uL (ref 3.4–10.8)

## 2023-12-16 LAB — LIPID PANEL
Chol/HDL Ratio: 4.9 ratio (ref 0.0–5.0)
Cholesterol, Total: 201 mg/dL — ABNORMAL HIGH (ref 100–199)
HDL: 41 mg/dL (ref >39)
LDL Chol Calc (NIH): 127 mg/dL — ABNORMAL HIGH (ref 0–99)
Triglycerides: 183 mg/dL — ABNORMAL HIGH (ref 0–149)
VLDL Cholesterol Cal: 33 mg/dL (ref 5–40)

## 2023-12-16 LAB — B12 AND FOLATE PANEL
Folate: 7.7 ng/mL (ref >3.0)
Vitamin B-12: 358 pg/mL (ref 232–1245)

## 2023-12-16 LAB — TSH+FREE T4
Free T4: 1.01 ng/dL (ref 0.82–1.77)
TSH: 2.26 u[IU]/mL (ref 0.450–4.500)

## 2023-12-16 LAB — HEMOGLOBIN A1C
Est. average glucose Bld gHb Est-mCnc: 137 mg/dL
Hgb A1c MFr Bld: 6.4 % — ABNORMAL HIGH (ref 4.8–5.6)

## 2023-12-16 LAB — PSA: Prostate Specific Ag, Serum: 1 ng/mL (ref 0.0–4.0)

## 2023-12-16 LAB — VITAMIN D 25 HYDROXY (VIT D DEFICIENCY, FRACTURES): Vit D, 25-Hydroxy: 22.4 ng/mL — ABNORMAL LOW (ref 30.0–100.0)

## 2023-12-16 MED ORDER — ATORVASTATIN CALCIUM 80 MG PO TABS: 80.0000 mg | ORAL_TABLET | Freq: Every day | ORAL | 3 refills | Status: AC

## 2023-12-16 NOTE — Assessment & Plan Note (Signed)
 His acute concern today is intermittent anxiety and a feeling of jitteriness that occurs multiple mornings of the week.  He is interested in as needed medication options for anxiety relief.  Through shared decision making, hydroxyzine 25 mg as needed for anxiety relief has been prescribed today.

## 2023-12-16 NOTE — Assessment & Plan Note (Signed)
 A1c 6.1 on previous labs.  Repeat A1c ordered today.

## 2023-12-16 NOTE — Assessment & Plan Note (Signed)
 Current weight 309 pounds.  BMI 45.6.  We discussed the need to make significant dietary changes and to begin regular exercise in order to lose weight.  He is potentially interested in medication options for weight loss, which I believe he would benefit from, with comorbidities including prediabetes and hypertension.  We specifically discussed KGMWNU.  He will research [facility] independently and follow-up if interested in starting medication.

## 2023-12-16 NOTE — Assessment & Plan Note (Signed)
 Remains adequately controlled on current antihypertensive regimen.

## 2023-12-16 NOTE — Assessment & Plan Note (Signed)
 He is currently prescribed atorvastatin  40 mg daily.  Repeat lipid panel ordered today.

## 2023-12-25 ENCOUNTER — Other Ambulatory Visit: Payer: Self-pay | Admitting: Internal Medicine

## 2023-12-25 DIAGNOSIS — R600 Localized edema: Secondary | ICD-10-CM

## 2024-01-12 ENCOUNTER — Telehealth: Payer: Self-pay

## 2024-01-12 NOTE — Telephone Encounter (Signed)
 Copied from CRM [id number]. Topic: Clinical - Request for Lab/Test Order >> Jan 12, 2024 11:22 AM Elle L wrote: Reason for CRM: The patient states Dr. Kermit Ped advised him to do a Cologuard test but he would be more comfortable doing a colonoscopy. The patient's call back number is [phone].

## 2024-01-13 ENCOUNTER — Other Ambulatory Visit: Payer: Self-pay

## 2024-01-13 DIAGNOSIS — Z1211 Encounter for screening for malignant neoplasm of colon: Secondary | ICD-10-CM

## 2024-01-13 NOTE — Telephone Encounter (Signed)
 Referral placed.

## 2024-01-21 ENCOUNTER — Ambulatory Visit: Payer: Self-pay

## 2024-01-21 VITALS — BP 130/72 | HR 90 | Resp 18 | Ht 69.0 in | Wt 316.0 lb

## 2024-01-21 DIAGNOSIS — F419 Anxiety disorder, unspecified: Secondary | ICD-10-CM

## 2024-01-21 DIAGNOSIS — Z6841 Body Mass Index (BMI) 40.0 and over, adult: Secondary | ICD-10-CM | POA: Diagnosis not present

## 2024-01-21 MED ORDER — SEMAGLUTIDE-WEIGHT MANAGEMENT 0.25 MG/0.5ML ~~LOC~~ SOAJ: 0.2500 mg | SUBCUTANEOUS | 0 refills | Status: AC

## 2024-01-21 MED ORDER — SEMAGLUTIDE-WEIGHT MANAGEMENT 0.5 MG/0.5ML ~~LOC~~ SOAJ: 0.5000 mg | SUBCUTANEOUS | 0 refills | Status: AC

## 2024-01-21 MED ORDER — SEMAGLUTIDE-WEIGHT MANAGEMENT 1.7 MG/0.75ML ~~LOC~~ SOAJ: 1.7000 mg | SUBCUTANEOUS | 0 refills | Status: DC

## 2024-01-21 MED ORDER — BUSPIRONE HCL 7.5 MG PO TABS: 7.5000 mg | ORAL_TABLET | Freq: Two times a day (BID) | ORAL | 2 refills | Status: DC

## 2024-01-21 MED ORDER — SEMAGLUTIDE-WEIGHT MANAGEMENT 2.4 MG/0.75ML ~~LOC~~ SOAJ: 2.4000 mg | SUBCUTANEOUS | 0 refills | Status: DC

## 2024-01-21 MED ORDER — SEMAGLUTIDE-WEIGHT MANAGEMENT 1 MG/0.5ML ~~LOC~~ SOAJ: 1.0000 mg | SUBCUTANEOUS | 0 refills | Status: DC

## 2024-01-21 NOTE — Progress Notes (Unsigned)
 Established Patient Office Visit  Subjective   Patient ID: Sean Mccarty, male    DOB: May 30, 1961  Age: 63 y.o. MRN: [id number]  Chief Complaint  Patient presents with   Weight Loss    Discuss wegovy for weight loss or similar medication     HPI Overweight/Obesity Complicated by depression:  New goal.  Unable to achieve goal weight loss through lifestyle modification alone; current treatment:dietary changes;   Medications/Strategies previously tried: none  Baseline weight: 240 lbs; most recent weight: 316 lbs  Current meal patterns: breakfast: egg, sausage; lunch: sandwich; dinner: eats out ; snacks: cookies, chips; drinks: Dr. Kathlene Paradise, [place]. 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   Exertional dyspnea 03/22/2023   Localized skin eruption 03/22/2023   Right hand pain 03/08/2023   Pain of finger of right hand 02/24/2023   Edema of left lower extremity 11/09/2022   Colon cancer screening 11/09/2022   Encounter for general adult medical examination with abnormal findings 11/09/2022   Morbid obesity (HCC) 11/09/2022   HTN (hypertension) 11/09/2022   HLD (hyperlipidemia) 11/09/2022   Chest pain 05/11/2012   Snoring 05/11/2012      ROS    Objective:     BP 130/72   Pulse 90   Resp 18   Ht 5' 9 (1.753 m)   Wt (!) 316 lb 0.6 oz (143.4 kg)   SpO2 93%   BMI 46.67 kg/m  BP Readings from Last 3  Encounters:  01/21/24 130/72  12/15/23 134/78  06/03/23 126/80   Wt Readings from Last 3 Encounters:  01/21/24 (!) 316 lb 0.6 oz (143.4 kg)  12/15/23 (!) 309 lb 6.4 oz (140.3 kg)  06/03/23 (!) 301 lb (136.5 kg)      Physical Exam Vitals and nursing note reviewed.  Constitutional:      Appearance: Normal appearance. He is obese.   Eyes:     Extraocular Movements: Extraocular movements intact.     Pupils: Pupils are equal, round, and reactive to light.    Cardiovascular:     Rate and Rhythm: Normal rate and regular rhythm.  Pulmonary:     Effort: Pulmonary effort is normal.     Breath sounds: Normal breath sounds.   Musculoskeletal:     Cervical back: Normal range of motion and neck supple.   Neurological:     Mental Status: He is alert and oriented to person, place, and time.   Psychiatric:        Mood and Affect: Mood normal.        Thought Content: Thought content normal.      No results found for any visits on 01/21/24.  Last CBC Lab Results  Component Value Date   WBC 6.9 12/15/2023   HGB 17.3 12/15/2023   HCT 51.7 (H) 12/15/2023   MCV 91 12/15/2023   MCH 30.3 12/15/2023   RDW 13.8 12/15/2023   PLT 193 12/15/2023   Last metabolic panel Lab Results  Component Value Date  GLUCOSE 107 (H) 12/15/2023   NA 142 12/15/2023   K 3.7 12/15/2023   CL 100 12/15/2023   CO2 23 12/15/2023   BUN 17 12/15/2023   CREATININE 1.50 (H) 12/15/2023   EGFR 52 (L) 12/15/2023   CALCIUM  9.6 12/15/2023   PROT 7.0 12/15/2023   ALBUMIN 4.4 12/15/2023   LABGLOB 2.6 12/15/2023   AGRATIO 1.5 11/10/2022   BILITOT 1.8 (H) 12/15/2023   ALKPHOS 57 12/15/2023   AST 38 12/15/2023   ALT 36 12/15/2023   Last hemoglobin A1c Lab Results  Component Value Date   HGBA1C 6.4 (H) 12/15/2023   Last thyroid functions Lab Results  Component Value Date   TSH 2.260 12/15/2023      The 10-year ASCVD risk score (Arnett DK, et al., 2019) is: 15.9%    Assessment & Plan:    Problem List Items Addressed This Visit       Other   Anxiety   He found hydroxyzine  to be too sedating.  Will add BuSpar for symptoms of anxiety.  Advised to watch for worsening of anxiety and/or depression.  Recommend follow-up in 2 to 3 months or sooner if needed.      Relevant Medications   busPIRone (BUSPAR) 7.5 MG tablet   BMI 45.0-49.9, adult (HCC) - Primary   Will add Wegovy in addition to lifestyle changes to help with weight loss.  Recommend follow-up in 2 to 3 months or sooner if experiencing adverse side effects.      Relevant Medications   Semaglutide-Weight Management 0.25 MG/0.5ML SOAJ   Semaglutide-Weight Management 0.5 MG/0.5ML SOAJ (Start on 02/19/2024)   Semaglutide-Weight Management 1 MG/0.5ML SOAJ (Start on 03/19/2024)   Semaglutide-Weight Management 1.7 MG/0.75ML SOAJ (Start on 04/17/2024)   Semaglutide-Weight Management 2.4 MG/0.75ML SOAJ (Start on 05/16/2024)    No follow-ups on file.    Alison Irvine, FNP

## 2024-01-24 ENCOUNTER — Other Ambulatory Visit (HOSPITAL_COMMUNITY): Payer: Self-pay

## 2024-01-24 ENCOUNTER — Telehealth: Payer: Self-pay | Admitting: Pharmacy Technician

## 2024-01-24 DIAGNOSIS — Z6841 Body Mass Index (BMI) 40.0 and over, adult: Secondary | ICD-10-CM | POA: Insufficient documentation

## 2024-01-24 NOTE — Assessment & Plan Note (Signed)
 Will add Wegovy in addition to lifestyle changes to help with weight loss.  Recommend follow-up in 2 to 3 months or sooner if experiencing adverse side effects.

## 2024-01-24 NOTE — Telephone Encounter (Signed)
 Pharmacy Patient Advocate Encounter   Received notification from CoverMyMeds that prior authorization for [facility] 0.25MG /0.5ML auto-injectors is required/requested.   Insurance verification completed.   The patient is insured through [facility] .   Per test claim: PA required; PA submitted to above mentioned insurance via CoverMyMeds Key/confirmation #/EOC [id number] Status is pending

## 2024-01-24 NOTE — Telephone Encounter (Signed)
 Pharmacy Patient Advocate Encounter  Received notification from [facility] that Prior Authorization for Wegovy 0.25MG /0.5ML auto-injectors has been APPROVED from 01/24/2024 to 07/22/2024. Ran test claim, Copay is $4.00. This test claim was processed through [facility]- copay amounts may vary at other pharmacies due to pharmacy/plan contracts, or as the patient moves through the different stages of their insurance plan.   PA #/Case ID/Reference #: [id number]

## 2024-01-24 NOTE — Assessment & Plan Note (Signed)
 He found hydroxyzine  to be too sedating.  Will add BuSpar for symptoms of anxiety.  Advised to watch for worsening of anxiety and/or depression.  Recommend follow-up in 2 to 3 months or sooner if needed.

## 2024-01-25 ENCOUNTER — Ambulatory Visit: Admitting: Physician Assistant

## 2024-02-09 ENCOUNTER — Encounter (INDEPENDENT_AMBULATORY_CARE_PROVIDER_SITE_OTHER): Payer: Self-pay | Admitting: *Deleted

## 2024-02-09 ENCOUNTER — Other Ambulatory Visit: Payer: Self-pay | Admitting: Internal Medicine

## 2024-02-09 DIAGNOSIS — R600 Localized edema: Secondary | ICD-10-CM

## 2024-02-09 NOTE — Telephone Encounter (Signed)
 Copied from CRM [id number]. Topic: Clinical - Medication Refill >> Feb 09, 2024  8:13 AM Avram MATSU wrote: Medication: torsemide  (DEMADEX ) 20 MG tablet [[id number]]  Has the patient contacted their pharmacy? Yes (Agent: If no, request that the patient contact the pharmacy for the refill. If patient does not wish to contact the pharmacy document the reason why and proceed with request.) (Agent: If yes, when and what did the pharmacy advise?)  This is the patient's preferred pharmacy:  [facility] [address], [place] - [address] [place] [address] Phone: [phone] Fax: [phone]  Is this the correct pharmacy for this prescription? Yes If no, delete pharmacy and type the correct one.   Has the prescription been filled recently? No  Is the patient out of the medication? Yes  Has the patient been seen for an appointment in the last year OR does the patient have an upcoming appointment? Yes  Can we respond through MyChart? No  Agent: Please be advised that Rx refills may take up to 3 business days. We ask that you follow-up with your pharmacy.

## 2024-02-09 NOTE — Telephone Encounter (Signed)
 Duplicate encounter, medication sent to pharmacy today, confirmed receipt. This encounter was created in error - please disregard.

## 2024-02-10 ENCOUNTER — Ambulatory Visit: Admitting: Physician Assistant

## 2024-02-10 DIAGNOSIS — Z91199 Patient's noncompliance with other medical treatment and regimen due to unspecified reason: Secondary | ICD-10-CM

## 2024-02-10 NOTE — Progress Notes (Signed)
 Patient no-showed today's appointment.

## 2024-03-15 ENCOUNTER — Other Ambulatory Visit: Payer: Self-pay

## 2024-03-15 DIAGNOSIS — R600 Localized edema: Secondary | ICD-10-CM

## 2024-03-23 ENCOUNTER — Other Ambulatory Visit: Payer: Self-pay

## 2024-03-23 DIAGNOSIS — Z6841 Body Mass Index (BMI) 40.0 and over, adult: Secondary | ICD-10-CM

## 2024-03-24 ENCOUNTER — Other Ambulatory Visit: Payer: Self-pay

## 2024-03-24 DIAGNOSIS — Z6841 Body Mass Index (BMI) 40.0 and over, adult: Secondary | ICD-10-CM

## 2024-03-24 MED ORDER — WEGOVY 1 MG/0.5ML ~~LOC~~ SOAJ: 1.0000 mg | SUBCUTANEOUS | 1 refills | Status: DC

## 2024-04-21 ENCOUNTER — Other Ambulatory Visit (HOSPITAL_COMMUNITY): Payer: Self-pay

## 2024-04-21 ENCOUNTER — Telehealth: Payer: Self-pay | Admitting: Pharmacy Technician

## 2024-04-21 ENCOUNTER — Telehealth: Payer: Self-pay

## 2024-04-21 NOTE — Telephone Encounter (Unsigned)
 Copied from CRM #[id number]. Topic: Clinical - Prescription Issue >> Apr 21, 2024  3:22 PM Tiffini S wrote: Reason for CRM: Patient called about the prescription for  semaglutide -weight management (WEGOVY ) 1 MG/0.5ML SOAJ SQ injection- was told that the prescription was rejected for insurance Please call the patient at [phone].

## 2024-04-21 NOTE — Telephone Encounter (Signed)
 Pharmacy Patient Advocate Encounter   Received notification from CoverMyMeds that prior authorization for Wegovy  1MG /0.5ML auto-injectors is required/requested.   Insurance verification completed.   The patient is insured through [employer] .  Wegovy  is not covered under Medicare Part D when used for weight loss alone. He would need to meet the following for coverage under Medicare Part D for CV reduction risk. Patient has had a change in insurance benefits. Looks like he has transitioned from [employer] to [employer].  Please advise if patient meets the criteria below.  Does pt have evidence of any of the following: Myocardial infarction (MI), (2) Prior ischemic or hemorrhagic stroke, (3) Symptomatic peripheral arterial disease (PAD) as evidence by one of the following (a) Intermittent claudication with ankle-brachial index (ABI) less than 0.85 (at rest); (b) Peripheral arterial revascularization procedure; (c) Amputation due to atherosclerotic disease?

## 2024-04-24 NOTE — Telephone Encounter (Signed)
 PA team is working on this, waiting for prescriber response

## 2024-04-25 ENCOUNTER — Ambulatory Visit (INDEPENDENT_AMBULATORY_CARE_PROVIDER_SITE_OTHER)

## 2024-04-25 VITALS — BP 136/91 | HR 87 | Ht 69.0 in | Wt 281.0 lb

## 2024-04-25 DIAGNOSIS — Z6841 Body Mass Index (BMI) 40.0 and over, adult: Secondary | ICD-10-CM

## 2024-04-25 DIAGNOSIS — I10 Essential (primary) hypertension: Secondary | ICD-10-CM | POA: Diagnosis not present

## 2024-04-25 DIAGNOSIS — R7303 Prediabetes: Secondary | ICD-10-CM

## 2024-04-25 DIAGNOSIS — E782 Mixed hyperlipidemia: Secondary | ICD-10-CM | POA: Diagnosis not present

## 2024-04-25 MED ORDER — PHENTERMINE HCL 37.5 MG PO TABS: 37.5000 mg | ORAL_TABLET | Freq: Every day | ORAL | 2 refills | Status: DC

## 2024-04-25 MED ORDER — LOSARTAN POTASSIUM 25 MG PO TABS: 25.0000 mg | ORAL_TABLET | Freq: Every day | ORAL | 2 refills | Status: AC

## 2024-04-25 MED ORDER — HYDROCHLOROTHIAZIDE 25 MG PO TABS: 25.0000 mg | ORAL_TABLET | Freq: Every day | ORAL | 2 refills | Status: AC

## 2024-04-25 NOTE — Progress Notes (Unsigned)
   Established Patient Office Visit  Subjective   Patient ID: Sean Mccarty, male    DOB: [birth year]  Age: 63 y.o. 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   Exertional dyspnea 03/22/2023   Localized skin eruption 03/22/2023   Right hand pain 03/08/2023   Pain of finger of right hand 02/24/2023   Edema of left lower extremity 11/09/2022   Colon cancer screening 11/09/2022   Encounter for general adult medical examination with abnormal findings 11/09/2022   Morbid obesity (HCC) 11/09/2022   HTN (hypertension) 11/09/2022   HLD (hyperlipidemia) 11/09/2022   Chest pain 05/11/2012   Snoring 05/11/2012    ROS    Objective:     BP (!) 136/91   Pulse 87   Ht 5' 9 (1.753 m)   Wt 281 lb (127.5 kg)   SpO2 94%   BMI 41.50 kg/m  BP Readings from Last 3 Encounters:  04/25/24 (!) 136/91  01/21/24 130/72  12/15/23 134/78   Wt Readings from Last 3 Encounters:  04/25/24 281 lb (127.5 kg)  01/21/24 (!) 316 lb 0.6 oz (143.4 kg)  12/15/23 (!) 309 lb 6.4 oz (140.3 kg)      Physical Exam   No results found for any visits on 04/25/24.  Last CBC Lab Results  Component Value Date   WBC 6.9 12/15/2023   HGB 17.3 12/15/2023   HCT 51.7 (H) 12/15/2023   MCV 91 12/15/2023   MCH 30.3 12/15/2023   RDW 13.8 12/15/2023   PLT 193 12/15/2023   Last metabolic panel Lab Results  Component Value Date   GLUCOSE 107 (H) 12/15/2023   NA 142 12/15/2023   K 3.7 12/15/2023   CL 100 12/15/2023   CO2 23 12/15/2023   BUN 17 12/15/2023   CREATININE 1.50 (H) 12/15/2023   EGFR 52 (L) 12/15/2023   CALCIUM  9.6 12/15/2023   PROT 7.0 12/15/2023   ALBUMIN 4.4 12/15/2023   LABGLOB 2.6  12/15/2023   AGRATIO 1.5 11/10/2022   BILITOT 1.8 (H) 12/15/2023   ALKPHOS 57 12/15/2023   AST 38 12/15/2023   ALT 36 12/15/2023   Last lipids Lab Results  Component Value Date   CHOL 201 (H) 12/15/2023   HDL 41 12/15/2023   LDLCALC 127 (H) 12/15/2023   TRIG 183 (H) 12/15/2023   CHOLHDL 4.9 12/15/2023   Last hemoglobin A1c Lab Results  Component Value Date   HGBA1C 6.4 (H) 12/15/2023   Last thyroid functions Lab Results  Component Value Date   TSH 2.260 12/15/2023   Last vitamin D  Lab Results  Component Value Date   VD25OH 22.4 (L) 12/15/2023   Last vitamin B12 and Folate Lab Results  Component Value Date   VITAMINB12 358 12/15/2023   FOLATE 7.7 12/15/2023      The 10-year ASCVD risk score (Arnett DK, et al., 2019) is: 17.1%    Assessment & Plan:   Problem List Items Addressed This Visit   None   No follow-ups on file.    Leita Longs, FNP

## 2024-04-26 LAB — LIPID PANEL
Chol/HDL Ratio: 4.4 ratio (ref 0.0–5.0)
Cholesterol, Total: 171 mg/dL (ref 100–199)
HDL: 39 mg/dL — ABNORMAL LOW (ref >39)
LDL Chol Calc (NIH): 112 mg/dL — ABNORMAL HIGH (ref 0–99)
Triglycerides: 111 mg/dL (ref 0–149)
VLDL Cholesterol Cal: 20 mg/dL (ref 5–40)

## 2024-04-26 LAB — BASIC METABOLIC PANEL WITH GFR
BUN/Creatinine Ratio: 11 (ref 10–24)
BUN: 18 mg/dL (ref 8–27)
CO2: 25 mmol/L (ref 20–29)
Calcium: 9.6 mg/dL (ref 8.6–10.2)
Chloride: 100 mmol/L (ref 96–106)
Creatinine, Ser: 1.69 mg/dL — ABNORMAL HIGH (ref 0.76–1.27)
Glucose: 89 mg/dL (ref 70–99)
Potassium: 3.9 mmol/L (ref 3.5–5.2)
Sodium: 141 mmol/L (ref 134–144)
eGFR: 45 mL/min/1.73 — ABNORMAL LOW (ref >59)

## 2024-04-26 LAB — HEMOGLOBIN A1C
Est. average glucose Bld gHb Est-mCnc: 148 mg/dL
Hgb A1c MFr Bld: 6.8 % — ABNORMAL HIGH (ref 4.8–5.6)

## 2024-04-27 ENCOUNTER — Other Ambulatory Visit: Payer: Self-pay

## 2024-04-28 NOTE — Assessment & Plan Note (Signed)
 He is currently prescribed atorvastatin  40 mg daily.  Repeat lipid panel ordered today.

## 2024-04-28 NOTE — Assessment & Plan Note (Signed)
 Obesity management complicated by insurance issues with Wegovy . Significant weight loss and improved energy levels noted with Wegovy , but insurance transition from Medicaid to Medicare prevents continuation. - Prescribe phentermine  once daily before breakfast. Start with half a tablet to assess tolerance. - Encourage dietary changes: reduce sugar and bread, increase protein and water. - Discuss online options for weight management medications.

## 2024-04-28 NOTE — Assessment & Plan Note (Signed)
 Impaired fasting glucose with borderline diabetes. Weight loss and dietary changes may have improved glucose levels. - Recheck A1c to assess current glucose control.

## 2024-04-28 NOTE — Assessment & Plan Note (Signed)
 Remains adequately controlled on current antihypertensive regimen.

## 2024-05-01 ENCOUNTER — Other Ambulatory Visit: Payer: Self-pay

## 2024-05-01 DIAGNOSIS — R600 Localized edema: Secondary | ICD-10-CM

## 2024-05-01 MED ORDER — TORSEMIDE 20 MG PO TABS: 20.0000 mg | ORAL_TABLET | Freq: Every day | ORAL | 0 refills | Status: DC

## 2024-05-01 NOTE — Telephone Encounter (Signed)
 Copied from CRM #[id number]. Topic: Clinical - Medication Refill >> May 01, 2024  1:44 PM Sophia H wrote: Medication: torsemide  (DEMADEX ) 20 MG tablet  Has the patient contacted their pharmacy? Yes, contact office for fill.   This is the patient's preferred pharmacy:  [facility] [address], [place] - [address] [place] [address] [place] [address] Phone: [phone] Fax: [phone]  Is this the correct pharmacy for this prescription? Yes If no, delete pharmacy and type the correct one.   Has the prescription been filled recently? Yes  Is the patient out of the medication? No, has a few left.   Has the patient been seen for an appointment in the last year OR does the patient have an upcoming appointment? Yes, seen 09/16.  Can we respond through MyChart? No, prefers phone call.   Agent: Please be advised that Rx refills may take up to 3 business days. We ask that you follow-up with your pharmacy.

## 2024-05-17 ENCOUNTER — Ambulatory Visit: Admitting: Physician Assistant

## 2024-05-17 DIAGNOSIS — Z91199 Patient's noncompliance with other medical treatment and regimen due to unspecified reason: Secondary | ICD-10-CM

## 2024-05-17 NOTE — Progress Notes (Signed)
 Patient no-showed today's appointment.

## 2024-06-06 ENCOUNTER — Other Ambulatory Visit: Payer: Self-pay

## 2024-06-06 DIAGNOSIS — R600 Localized edema: Secondary | ICD-10-CM

## 2024-06-06 DIAGNOSIS — Z6841 Body Mass Index (BMI) 40.0 and over, adult: Secondary | ICD-10-CM

## 2024-06-06 MED ORDER — PHENTERMINE HCL 37.5 MG PO TABS: 37.5000 mg | ORAL_TABLET | Freq: Every day | ORAL | 0 refills | Status: AC

## 2024-06-06 MED ORDER — TORSEMIDE 20 MG PO TABS: 20.0000 mg | ORAL_TABLET | Freq: Every day | ORAL | 0 refills | Status: DC

## 2024-06-06 NOTE — Telephone Encounter (Signed)
 Copied from CRM [id number]. Topic: Clinical - Medication Refill >> Jun 06, 2024  8:39 AM Antwanette L wrote: Medication:  torsemide  (DEMADEX ) 20 MG tablet phentermine  (ADIPEX-P ) 37.5 MG tablet   Has the patient contacted their pharmacy? No  This is the patient's preferred pharmacy:  [facility] [address] [place] - [address] [place] [place] [address] Phone: [phone] Fax: [phone]  Is this the correct pharmacy for this prescription? Yes   Has the prescription been filled recently? Yes  Is the patient out of the medication? No. Pt has 3 days worth of medicine  Has the patient been seen for an appointment in the last year OR does the patient have an upcoming appointment? Yes.Last ov with Leita Longs FNP was on 04/25/24 and next appt is 08/02/24  Can we respond through MyChart? No. Contact the patient by phone at [phone]  Agent: Please be advised that Rx refills may take up to 3 business days. We ask that you follow-up with your pharmacy.

## 2024-06-16 ENCOUNTER — Ambulatory Visit

## 2024-06-20 ENCOUNTER — Telehealth: Payer: Self-pay

## 2024-06-20 NOTE — Telephone Encounter (Signed)
 Copied from CRM [id number]. Topic: Clinical - Medication Question >> Jun 19, 2024 11:47 AM Edsel HERO wrote: Patient states that he is currently taking phentermine  (ADIPEX-P ) 37.5 MG tablet and it is not working like the Wegovy  was. Patient would like to know what it would cost out of pocket for the Wegovy  and if he can be switched back to it. Please advise.

## 2024-06-22 NOTE — Telephone Encounter (Signed)
 Left detailed message.

## 2024-06-22 NOTE — Telephone Encounter (Signed)
 I do not know what it would cost out-of-pocket.  Probably $1200-$1400.

## 2024-06-22 NOTE — Telephone Encounter (Signed)
 Advised pt, pt going to try and get Zepbound through [employer]

## 2024-07-02 ENCOUNTER — Other Ambulatory Visit: Payer: Self-pay

## 2024-07-02 ENCOUNTER — Ambulatory Visit: Payer: Self-pay

## 2024-07-02 DIAGNOSIS — R600 Localized edema: Secondary | ICD-10-CM

## 2024-07-05 ENCOUNTER — Other Ambulatory Visit: Payer: Self-pay

## 2024-07-05 DIAGNOSIS — R600 Localized edema: Secondary | ICD-10-CM

## 2024-07-11 ENCOUNTER — Telehealth: Payer: Self-pay | Admitting: Pharmacy Technician

## 2024-07-11 ENCOUNTER — Other Ambulatory Visit (HOSPITAL_COMMUNITY): Payer: Self-pay

## 2024-07-11 ENCOUNTER — Other Ambulatory Visit: Payer: Self-pay

## 2024-07-11 DIAGNOSIS — E1122 Type 2 diabetes mellitus with diabetic chronic kidney disease: Secondary | ICD-10-CM

## 2024-07-11 MED ORDER — TIRZEPATIDE 2.5 MG/0.5ML ~~LOC~~ SOAJ: 2.5000 mg | SUBCUTANEOUS | 0 refills | Status: AC

## 2024-07-11 NOTE — Telephone Encounter (Signed)
 Pharmacy Patient Advocate Encounter   Received notification from CoverMyMeds that prior authorization for Mounjaro 2.5MG /0.5ML auto-injectors is required/requested.   Insurance verification completed.   The patient is insured through [employer].   Per test claim: PA required; PA submitted to above mentioned insurance via CoverMyMeds Key/confirmation #/EOC [id number] Status is pending

## 2024-07-11 NOTE — Telephone Encounter (Signed)
 Pharmacy Patient Advocate Encounter  Received notification from [facility] that Prior Authorization for Mounjaro 2.5MG /0.5ML auto-injectors has been APPROVED from 07/11/2024 to 07/11/2025. Ran test claim, Copay is $4.80. This test claim was processed through [facility]- copay amounts may vary at other pharmacies due to pharmacy/plan contracts, or as the patient moves through the different stages of their insurance plan.   PA #/Case ID/Reference #: [id number]

## 2024-07-12 ENCOUNTER — Telehealth: Payer: Self-pay

## 2024-07-12 NOTE — Telephone Encounter (Signed)
 Called pt to make him aware His labs showed an increase in blood sugar levels on previous labs and a decrease in kidney function. Leita is going to try to get Ozempic  or Mounjaro approved since he is not in the diabetic range.

## 2024-07-12 NOTE — Telephone Encounter (Signed)
 Noted

## 2024-07-12 NOTE — Telephone Encounter (Signed)
 Copied from CRM [id number]. Topic: Clinical - Lab/Test Results >> Jul 12, 2024 10:42 AM Alfonso HERO wrote: Reason for CRM: patient called back. I relayed the message to him about his blood sugar and kidney function and the medication. He understood and said if you needed to call him back to give more info you can.

## 2024-07-24 ENCOUNTER — Telehealth: Payer: Self-pay | Admitting: Pharmacy Technician

## 2024-07-24 ENCOUNTER — Other Ambulatory Visit (HOSPITAL_COMMUNITY): Payer: Self-pay

## 2024-07-24 NOTE — Telephone Encounter (Signed)
 Pharmacy Patient Advocate Encounter   Received notification from CoverMyMeds that prior authorization for Wegovy  0.25MG /0.5ML auto-injectors is due for renewal.   Insurance verification completed.   The patient is insured through [employer].  Action: Medication has been discontinued. Archived Key: [id number]

## 2024-08-02 ENCOUNTER — Ambulatory Visit

## 2024-08-15 ENCOUNTER — Encounter (INDEPENDENT_AMBULATORY_CARE_PROVIDER_SITE_OTHER): Payer: Self-pay | Admitting: *Deleted

## 2024-08-15 DIAGNOSIS — R600 Localized edema: Secondary | ICD-10-CM

## 2024-08-15 MED ORDER — TORSEMIDE 20 MG PO TABS: 20.0000 mg | ORAL_TABLET | Freq: Every day | ORAL | 0 refills | Status: AC

## 2024-08-15 NOTE — Telephone Encounter (Signed)
 Copied from CRM [id number]. Topic: Clinical - Medication Refill >> Aug 15, 2024  8:55 AM Selinda RAMAN wrote: Medication: torsemide  (DEMADEX ) 20 MG tablet  Has the patient contacted their pharmacy? Yes  This is the patient's preferred pharmacy:  [facility] [address] [place] - [address] [address] [address] [place] [address] Phone: [phone] Fax: [phone]  Is this the correct pharmacy for this prescription? Yes If no, delete pharmacy and type the correct one.   Has the prescription been filled recently? No  Is the patient out of the medication? No but he only has a few days left  Has the patient been seen for an appointment in the last year OR does the patient have an upcoming appointment? Yes  Can we respond through MyChart? No  Please assist patient further

## 2024-09-07 ENCOUNTER — Ambulatory Visit

## 2024-12-04 ENCOUNTER — Ambulatory Visit: Payer: Self-pay
